# Patient Record
Sex: Male | Born: 1955 | Race: White | Hispanic: No | Marital: Married | Smoking: Current some day smoker
Health system: Southern US, Community
[De-identification: ages and names within clinical notes are randomized; demographics above are authoritative.]

## PROBLEM LIST (undated history)

## (undated) DIAGNOSIS — E785 Hyperlipidemia, unspecified: Secondary | ICD-10-CM

## (undated) DIAGNOSIS — I4510 Unspecified right bundle-branch block: Secondary | ICD-10-CM

## (undated) DIAGNOSIS — G4733 Obstructive sleep apnea (adult) (pediatric): Secondary | ICD-10-CM

## (undated) DIAGNOSIS — R55 Syncope and collapse: Secondary | ICD-10-CM

## (undated) DIAGNOSIS — E875 Hyperkalemia: Secondary | ICD-10-CM

## (undated) HISTORY — PX: CARDIAC CATHETERIZATION: SHX172

## (undated) HISTORY — DX: Hyperlipidemia, unspecified: E78.5

## (undated) HISTORY — DX: Unspecified right bundle-branch block: I45.10

## (undated) HISTORY — DX: Obstructive sleep apnea (adult) (pediatric): G47.33

## (undated) HISTORY — PX: HERNIA REPAIR: SHX51

## (undated) HISTORY — DX: Syncope and collapse: R55

## (undated) HISTORY — PX: LOOP RECORDER IMPLANT: SHX5954

## (undated) HISTORY — DX: Hyperkalemia: E87.5

---

## 2008-11-02 ENCOUNTER — Ambulatory Visit: Payer: Self-pay | Admitting: Critical Care Medicine

## 2008-11-02 ENCOUNTER — Inpatient Hospital Stay (HOSPITAL_COMMUNITY): Admission: AD | Admit: 2008-11-02 | Discharge: 2008-11-04 | Payer: Self-pay | Admitting: Internal Medicine

## 2008-11-02 ENCOUNTER — Other Ambulatory Visit: Payer: Self-pay | Admitting: Emergency Medicine

## 2008-11-02 ENCOUNTER — Other Ambulatory Visit: Payer: Self-pay | Admitting: Internal Medicine

## 2009-10-20 ENCOUNTER — Inpatient Hospital Stay (HOSPITAL_COMMUNITY): Admission: EM | Admit: 2009-10-20 | Discharge: 2009-10-23 | Payer: Self-pay | Admitting: Emergency Medicine

## 2009-10-20 ENCOUNTER — Ambulatory Visit: Payer: Self-pay | Admitting: Internal Medicine

## 2009-10-22 ENCOUNTER — Ambulatory Visit: Payer: Self-pay | Admitting: Vascular Surgery

## 2009-10-22 ENCOUNTER — Encounter: Payer: Self-pay | Admitting: Internal Medicine

## 2009-10-22 ENCOUNTER — Encounter (INDEPENDENT_AMBULATORY_CARE_PROVIDER_SITE_OTHER): Payer: Self-pay | Admitting: Internal Medicine

## 2010-06-27 ENCOUNTER — Encounter: Admission: RE | Admit: 2010-06-27 | Discharge: 2010-06-27 | Payer: Self-pay | Admitting: Family Medicine

## 2010-07-04 ENCOUNTER — Emergency Department (HOSPITAL_COMMUNITY): Admission: EM | Admit: 2010-07-04 | Discharge: 2010-07-04 | Payer: Self-pay | Admitting: Emergency Medicine

## 2010-07-11 DIAGNOSIS — K219 Gastro-esophageal reflux disease without esophagitis: Secondary | ICD-10-CM | POA: Insufficient documentation

## 2010-12-05 LAB — BASIC METABOLIC PANEL
BUN: 15 mg/dL (ref 6–23)
CO2: 21 mEq/L (ref 19–32)
Calcium: 9.3 mg/dL (ref 8.4–10.5)
Chloride: 103 mEq/L (ref 96–112)
Creatinine, Ser: 1.14 mg/dL (ref 0.4–1.5)
GFR calc Af Amer: >60 mL/min (ref >60)
GFR calc non Af Amer: >60 mL/min (ref >60)
Glucose, Bld: 237 mg/dL — ABNORMAL HIGH (ref 70–99)
Potassium: 3.6 mEq/L (ref 3.5–5.1)
Sodium: 134 mEq/L — ABNORMAL LOW (ref 135–145)

## 2010-12-05 LAB — CBC
HCT: 45.7 % (ref 39.0–52.0)
Hemoglobin: 16.5 g/dL (ref 13.0–17.0)
MCH: 34.8 pg — ABNORMAL HIGH (ref 26.0–34.0)
MCHC: 36.1 g/dL — ABNORMAL HIGH (ref 30.0–36.0)
MCV: 96.4 fL (ref 78.0–100.0)
Platelets: 196 10*3/uL (ref 150–400)
RBC: 4.74 MIL/uL (ref 4.22–5.81)
RDW: 12.1 % (ref 11.5–15.5)
WBC: 9.4 10*3/uL (ref 4.0–10.5)

## 2010-12-05 LAB — URINALYSIS, ROUTINE W REFLEX MICROSCOPIC
Bilirubin Urine: NEGATIVE
Glucose, UA: >1000 mg/dL — AB
Hgb urine dipstick: NEGATIVE
Ketones, ur: 40 mg/dL — AB
Leukocytes, UA: NEGATIVE
Nitrite: NEGATIVE
Protein, ur: NEGATIVE mg/dL
Specific Gravity, Urine: 1.041 — ABNORMAL HIGH (ref 1.005–1.030)
Urobilinogen, UA: 0.2 mg/dL (ref 0.0–1.0)
pH: 6 (ref 5.0–8.0)

## 2010-12-05 LAB — DIFFERENTIAL
Basophils Absolute: 0 10*3/uL (ref 0.0–0.1)
Basophils Relative: 0 % (ref 0–1)
Eosinophils Absolute: 0.1 10*3/uL (ref 0.0–0.7)
Eosinophils Relative: 1 % (ref 0–5)
Lymphocytes Relative: 24 % (ref 12–46)
Lymphs Abs: 2.3 10*3/uL (ref 0.7–4.0)
Monocytes Absolute: 0.5 10*3/uL (ref 0.1–1.0)
Monocytes Relative: 5 % (ref 3–12)
Neutro Abs: 6.5 10*3/uL (ref 1.7–7.7)
Neutrophils Relative %: 70 % (ref 43–77)

## 2010-12-05 LAB — GLUCOSE, CAPILLARY
Glucose-Capillary: 102 mg/dL — ABNORMAL HIGH (ref 70–99)
Glucose-Capillary: 251 mg/dL — ABNORMAL HIGH (ref 70–99)
Glucose-Capillary: 360 mg/dL — ABNORMAL HIGH (ref 70–99)

## 2010-12-05 LAB — URINE MICROSCOPIC-ADD ON

## 2010-12-08 LAB — BASIC METABOLIC PANEL
BUN: 15 mg/dL (ref 6–23)
CO2: 24 mEq/L (ref 19–32)
Calcium: 8.8 mg/dL (ref 8.4–10.5)
Chloride: 107 mEq/L (ref 96–112)
Creatinine, Ser: 0.89 mg/dL (ref 0.4–1.5)
GFR calc Af Amer: >60 mL/min (ref >60)
GFR calc non Af Amer: >60 mL/min (ref >60)
Glucose, Bld: 235 mg/dL — ABNORMAL HIGH (ref 70–99)
Potassium: 4.2 mEq/L (ref 3.5–5.1)
Sodium: 138 mEq/L (ref 135–145)

## 2010-12-08 LAB — ETHANOL: Alcohol, Ethyl (B): <5 mg/dL (ref 0–10)

## 2010-12-08 LAB — HEPATIC FUNCTION PANEL
ALT: 19 U/L (ref 0–53)
AST: 22 U/L (ref 0–37)
Albumin: 3.5 g/dL (ref 3.5–5.2)
Alkaline Phosphatase: 49 U/L (ref 39–117)
Bilirubin, Direct: 0.2 mg/dL (ref 0.0–0.3)
Indirect Bilirubin: 0.6 mg/dL (ref 0.3–0.9)
Total Bilirubin: 0.8 mg/dL (ref 0.3–1.2)
Total Protein: 5.7 g/dL — ABNORMAL LOW (ref 6.0–8.3)

## 2010-12-08 LAB — CBC
HCT: 41.3 % (ref 39.0–52.0)
HCT: 46.6 % (ref 39.0–52.0)
Hemoglobin: 14.3 g/dL (ref 13.0–17.0)
Hemoglobin: 15.7 g/dL (ref 13.0–17.0)
MCHC: 33.6 g/dL (ref 30.0–36.0)
MCV: 103.2 fL — ABNORMAL HIGH (ref 78.0–100.0)
Platelets: 163 10*3/uL (ref 150–400)
Platelets: 169 10*3/uL (ref 150–400)
RBC: 4.03 MIL/uL — ABNORMAL LOW (ref 4.22–5.81)
RBC: 4.52 MIL/uL (ref 4.22–5.81)
RDW: 13.1 % (ref 11.5–15.5)
RDW: 13.2 % (ref 11.5–15.5)
RDW: 13.4 % (ref 11.5–15.5)
WBC: 8.6 10*3/uL (ref 4.0–10.5)
WBC: 9.9 10*3/uL (ref 4.0–10.5)

## 2010-12-08 LAB — MRSA PCR SCREENING: MRSA by PCR: NEGATIVE

## 2010-12-08 LAB — POCT I-STAT, CHEM 8
Creatinine, Ser: 0.9 mg/dL (ref 0.4–1.5)
Glucose, Bld: 190 mg/dL — ABNORMAL HIGH (ref 70–99)
HCT: 42 % (ref 39.0–52.0)
Hemoglobin: 14.3 g/dL (ref 13.0–17.0)
Potassium: 3.8 mEq/L (ref 3.5–5.1)
Sodium: 139 mEq/L (ref 135–145)
TCO2: 26 mmol/L (ref 0–100)

## 2010-12-08 LAB — COMPREHENSIVE METABOLIC PANEL
ALT: 15 U/L (ref 0–53)
AST: 16 U/L (ref 0–37)
Albumin: 3 g/dL — ABNORMAL LOW (ref 3.5–5.2)
Alkaline Phosphatase: 44 U/L (ref 39–117)
BUN: 13 mg/dL (ref 6–23)
CO2: 24 mEq/L (ref 19–32)
Chloride: 111 mEq/L (ref 96–112)
Creatinine, Ser: 0.83 mg/dL (ref 0.4–1.5)
GFR calc Af Amer: >60 mL/min (ref >60)
GFR calc non Af Amer: >60 mL/min (ref >60)
Glucose, Bld: 216 mg/dL — ABNORMAL HIGH (ref 70–99)
Potassium: 4 mEq/L (ref 3.5–5.1)
Sodium: 139 mEq/L (ref 135–145)
Total Bilirubin: 0.7 mg/dL (ref 0.3–1.2)
Total Protein: 5.1 g/dL — ABNORMAL LOW (ref 6.0–8.3)

## 2010-12-08 LAB — GLUCOSE, CAPILLARY
Glucose-Capillary: 138 mg/dL — ABNORMAL HIGH (ref 70–99)
Glucose-Capillary: 140 mg/dL — ABNORMAL HIGH (ref 70–99)
Glucose-Capillary: 208 mg/dL — ABNORMAL HIGH (ref 70–99)
Glucose-Capillary: 295 mg/dL — ABNORMAL HIGH (ref 70–99)
Glucose-Capillary: 357 mg/dL — ABNORMAL HIGH (ref 70–99)
Glucose-Capillary: 366 mg/dL — ABNORMAL HIGH (ref 70–99)
Glucose-Capillary: 56 mg/dL — ABNORMAL LOW (ref 70–99)
Glucose-Capillary: 58 mg/dL — ABNORMAL LOW (ref 70–99)
Glucose-Capillary: 87 mg/dL (ref 70–99)

## 2010-12-08 LAB — CK TOTAL AND CKMB (NOT AT ARMC)
CK, MB: 1.7 ng/mL (ref 0.3–4.0)
CK, MB: 1.7 ng/mL (ref 0.3–4.0)
CK, MB: 2.3 ng/mL (ref 0.3–4.0)
Relative Index: INVALID (ref 0.0–2.5)
Relative Index: INVALID (ref 0.0–2.5)
Total CK: 73 U/L (ref 7–232)

## 2010-12-08 LAB — DIFFERENTIAL
Basophils Absolute: 0 10*3/uL (ref 0.0–0.1)
Basophils Relative: 0 % (ref 0–1)
Eosinophils Absolute: 0.2 10*3/uL (ref 0.0–0.7)
Eosinophils Relative: 3 % (ref 0–5)
Lymphocytes Relative: 23 % (ref 12–46)
Lymphs Abs: 1.9 10*3/uL (ref 0.7–4.0)
Monocytes Absolute: 0.6 10*3/uL (ref 0.1–1.0)
Monocytes Relative: 6 % (ref 3–12)
Neutro Abs: 5.8 10*3/uL (ref 1.7–7.7)
Neutrophils Relative %: 68 % (ref 43–77)

## 2010-12-08 LAB — RAPID URINE DRUG SCREEN, HOSP PERFORMED
Amphetamines: NOT DETECTED
Barbiturates: NOT DETECTED
Benzodiazepines: NOT DETECTED
Cocaine: NOT DETECTED
Opiates: NOT DETECTED
Tetrahydrocannabinol: NOT DETECTED

## 2010-12-08 LAB — PROTIME-INR
INR: 0.91 (ref 0.00–1.49)
Prothrombin Time: 12.2 seconds (ref 11.6–15.2)

## 2010-12-08 LAB — TROPONIN I
Troponin I: 0.01 ng/mL (ref 0.00–0.06)
Troponin I: 0.03 ng/mL (ref 0.00–0.06)

## 2010-12-08 LAB — POCT CARDIAC MARKERS
CKMB, poc: <1 ng/mL — ABNORMAL LOW (ref 1.0–8.0)
Myoglobin, poc: 30.1 ng/mL (ref 12–200)
Troponin i, poc: <0.05 ng/mL (ref 0.00–0.09)

## 2010-12-08 LAB — LIPID PANEL
HDL: 33 mg/dL — ABNORMAL LOW (ref >39)
Total CHOL/HDL Ratio: 4.6 RATIO
Triglycerides: 141 mg/dL (ref <150)
VLDL: 28 mg/dL (ref 0–40)

## 2010-12-08 LAB — LIPASE, BLOOD: Lipase: 18 U/L (ref 11–59)

## 2010-12-08 LAB — HEMOGLOBIN A1C: Hgb A1c MFr Bld: 9.8 % — ABNORMAL HIGH (ref 4.6–6.1)

## 2010-12-08 LAB — D-DIMER, QUANTITATIVE: D-Dimer, Quant: 0.26 ug/mL-FEU (ref 0.00–0.48)

## 2010-12-08 LAB — APTT: aPTT: 23 seconds — ABNORMAL LOW (ref 24–37)

## 2010-12-11 LAB — GLUCOSE, CAPILLARY
Glucose-Capillary: 179 mg/dL — ABNORMAL HIGH (ref 70–99)
Glucose-Capillary: 98 mg/dL (ref 70–99)

## 2010-12-29 ENCOUNTER — Inpatient Hospital Stay (HOSPITAL_COMMUNITY)
Admission: EM | Admit: 2010-12-29 | Discharge: 2010-12-30 | DRG: 125 | Disposition: A | Discharge status: Home / Self Care | Payer: BC Managed Care – PPO | Attending: Internal Medicine | Admitting: Internal Medicine

## 2010-12-29 ENCOUNTER — Emergency Department (HOSPITAL_COMMUNITY): Payer: BC Managed Care – PPO

## 2010-12-29 DIAGNOSIS — G4733 Obstructive sleep apnea (adult) (pediatric): Secondary | ICD-10-CM | POA: Diagnosis present

## 2010-12-29 DIAGNOSIS — I2510 Atherosclerotic heart disease of native coronary artery without angina pectoris: Secondary | ICD-10-CM | POA: Diagnosis present

## 2010-12-29 DIAGNOSIS — F17200 Nicotine dependence, unspecified, uncomplicated: Secondary | ICD-10-CM | POA: Diagnosis present

## 2010-12-29 DIAGNOSIS — R55 Syncope and collapse: Secondary | ICD-10-CM | POA: Diagnosis present

## 2010-12-29 DIAGNOSIS — E119 Type 2 diabetes mellitus without complications: Secondary | ICD-10-CM | POA: Diagnosis present

## 2010-12-29 DIAGNOSIS — I4510 Unspecified right bundle-branch block: Secondary | ICD-10-CM | POA: Diagnosis present

## 2010-12-29 DIAGNOSIS — I10 Essential (primary) hypertension: Secondary | ICD-10-CM | POA: Diagnosis present

## 2010-12-29 DIAGNOSIS — R079 Chest pain, unspecified: Principal | ICD-10-CM | POA: Diagnosis present

## 2010-12-29 DIAGNOSIS — E785 Hyperlipidemia, unspecified: Secondary | ICD-10-CM | POA: Diagnosis present

## 2010-12-29 DIAGNOSIS — Z9641 Presence of insulin pump (external) (internal): Secondary | ICD-10-CM

## 2010-12-29 LAB — CBC
Hemoglobin: 14 g/dL (ref 13.0–17.0)
MCH: 34.4 pg — ABNORMAL HIGH (ref 26.0–34.0)
MCH: 33.8 pg (ref 26.0–34.0)
MCHC: 35.9 g/dL (ref 30.0–36.0)
MCV: 95.7 fL (ref 78.0–100.0)
Platelets: 217 10*3/uL (ref 150–400)
RBC: 4.86 MIL/uL (ref 4.22–5.81)
RBC: 4.14 MIL/uL — ABNORMAL LOW (ref 4.22–5.81)

## 2010-12-29 LAB — POCT I-STAT, CHEM 8
BUN: 13 mg/dL (ref 6–23)
Calcium, Ion: 1.13 mmol/L (ref 1.12–1.32)
Creatinine, Ser: 1.2 mg/dL (ref 0.4–1.5)
Hemoglobin: 16 g/dL (ref 13.0–17.0)
Sodium: 143 mEq/L (ref 135–145)
TCO2: 22 mmol/L (ref 0–100)

## 2010-12-29 LAB — COMPREHENSIVE METABOLIC PANEL
Albumin: 3.1 g/dL — ABNORMAL LOW (ref 3.5–5.2)
BUN: 12 mg/dL (ref 6–23)
Creatinine, Ser: 0.82 mg/dL (ref 0.4–1.5)
Potassium: 3.6 mEq/L (ref 3.5–5.1)
Total Protein: 4.9 g/dL — ABNORMAL LOW (ref 6.0–8.3)

## 2010-12-29 LAB — DIFFERENTIAL
Basophils Relative: 0 % (ref 0–1)
Eosinophils Absolute: 0.3 10*3/uL (ref 0.0–0.7)
Lymphs Abs: 4.5 10*3/uL — ABNORMAL HIGH (ref 0.7–4.0)
Monocytes Absolute: 0.7 10*3/uL (ref 0.1–1.0)
Monocytes Relative: 7 % (ref 3–12)
Neutrophils Relative %: 49 % (ref 43–77)

## 2010-12-29 LAB — GLUCOSE, CAPILLARY
Glucose-Capillary: 71 mg/dL (ref 70–99)
Glucose-Capillary: 131 mg/dL — ABNORMAL HIGH (ref 70–99)

## 2010-12-29 LAB — CARDIAC PANEL(CRET KIN+CKTOT+MB+TROPI)
CK, MB: 2 ng/mL (ref 0.3–4.0)
Relative Index: INVALID (ref 0.0–2.5)
Total CK: 63 U/L (ref 7–232)
Total CK: 61 U/L (ref 7–232)

## 2010-12-29 LAB — CK TOTAL AND CKMB (NOT AT ARMC)
CK, MB: 2.3 ng/mL (ref 0.3–4.0)
Total CK: 90 U/L (ref 7–232)

## 2010-12-29 LAB — PROTIME-INR
INR: 0.92 (ref 0.00–1.49)
INR: 1.09 (ref 0.00–1.49)
Prothrombin Time: 12.6 seconds (ref 11.6–15.2)
Prothrombin Time: 14.3 seconds (ref 11.6–15.2)

## 2010-12-29 LAB — BASIC METABOLIC PANEL
CO2: 19 mEq/L (ref 19–32)
Chloride: 108 mEq/L (ref 96–112)
GFR calc Af Amer: >60 mL/min (ref >60)
Glucose, Bld: 54 mg/dL — ABNORMAL LOW (ref 70–99)
Sodium: 139 mEq/L (ref 135–145)

## 2010-12-29 LAB — APTT: aPTT: 22 seconds — ABNORMAL LOW (ref 24–37)

## 2010-12-29 LAB — POCT CARDIAC MARKERS

## 2010-12-30 LAB — GLUCOSE, CAPILLARY
Glucose-Capillary: 213 mg/dL — ABNORMAL HIGH (ref 70–99)
Glucose-Capillary: 241 mg/dL — ABNORMAL HIGH (ref 70–99)

## 2010-12-30 LAB — BASIC METABOLIC PANEL
BUN: 13 mg/dL (ref 6–23)
CO2: 22 mEq/L (ref 19–32)
GFR calc non Af Amer: >60 mL/min (ref >60)
Glucose, Bld: 212 mg/dL — ABNORMAL HIGH (ref 70–99)
Potassium: 4.4 mEq/L (ref 3.5–5.1)

## 2010-12-30 LAB — CARDIAC PANEL(CRET KIN+CKTOT+MB+TROPI)
Relative Index: INVALID (ref 0.0–2.5)
Troponin I: 0.05 ng/mL (ref 0.00–0.06)

## 2010-12-30 LAB — LIPID PANEL
HDL: 48 mg/dL (ref >39)
VLDL: 25 mg/dL (ref 0–40)

## 2010-12-30 LAB — CBC
HCT: 42.2 % (ref 39.0–52.0)
MCHC: 35.1 g/dL (ref 30.0–36.0)
MCV: 96.1 fL (ref 78.0–100.0)
RDW: 12.7 % (ref 11.5–15.5)
WBC: 18.3 10*3/uL — ABNORMAL HIGH (ref 4.0–10.5)

## 2011-01-02 NOTE — H&P (Signed)
Mike Stewart, INGRUM NO.:  [id number]  MEDICAL RECORD NO.:  [id number]           PATIENT TYPE:  E  LOCATION:  [facility]                         FACILITY:  [facility]  PHYSICIAN:  Arturo Morton. Riley Kill, MD, FACCDATE OF BIRTH:  [birth year]  DATE OF ADMISSION:  12/29/2010 DATE OF DISCHARGE:                             HISTORY & PHYSICAL   PRIMARY CARDIOLOGIST:  Pricilla Riffle, MD, [facility]  PRIMARY CARE PHYSICIAN:  Marcene Duos, MD  REASON FOR ADMISSION:  ST elevated MI.  HISTORY OF PRESENT ILLNESS:  This is a 55 year old Caucasian male with known history of insulin-dependent diabetes with an insulin pump, hyperlipidemia, obstructive sleep apnea, chronic tobacco who was in usual state of health, was checking blood glucose prior to eating Easter lunch, had sudden syncopal episode, became very pale.  Family called EMS.  He described a burning pain on the left side of his chest with radiation and tingling to the left arm.  EMS called Code STEMI when seeing EKG changes in inferior laterally with elevated J-point and ST elevation.  The patient was brought emergently to the emergency room with EKGs were evaluated by both Dr. Eldridge Dace and Dr. Riley Kill and it was felt that the patient would be best served to have cardiac catheterization for definitive evaluation.  The patient was brought to cardiac catheterization lab and the diagnostics are being completed at the time of this dictation.  REVIEW OF SYSTEMS:  Positive for syncope and chest pain.  All other systems are reviewed and found to be negative unless listed above.  Code status is full.  PAST MEDICAL HISTORY:  Diabetes, hyperlipidemia, obstructive sleep apnea, chronic tobacco, history of EtOH use, noncardiac chest pain. Most recent stress test, dobutamine stress echo was found to be normal in January 2011.  PAST SURGICAL HISTORY:  Hernia repair.  SOCIAL HISTORY:  He lives in [place] with his wife.  He has 2  sons. He smokes cigars and drinks alcohol.  FAMILY HISTORY:  Uncertain family history as he is undergoing cath, unable to ask.  MEDICATIONS:  Prior to admission, 1. Aspirin 81 mg daily. 2. Nitroglycerin 0.4 mg daily. 3. Pantoprazole daily. 4. Sucralfate 1 gram daily. 5. Skelaxin daily. 6. Simvastatin 40 mg daily. 7. Insulin pump.  ALLERGIES:  IODINE.  LABORATORY DATA:  Current labs; sodium 141, potassium 3.1, chloride 108, CO2 of 22, BUN 13, creatinine 1.2, glucose 51.  CBC is pending.  Chest x- ray is pending.  EKG revealing ST elevation inferior laterally with elevated J-point noted, rate of 119 beats per minute.  Chest x-ray as stated is pending.  PHYSICAL EXAM:  VITAL SIGNS:  Blood pressure 109/62, pulse 119, respirations 30, temperature 97.8, weight 160 pounds, O2 sat 100% on 2 liters. GENERAL:  He is awake and alert complaining of chest pain. HEENT:  Head is normocephalic and atraumatic.  Eyes, PERRLA. NECK:  Supple.  There is no JVD.  No carotid bruit appreciated. CARDIOVASCULAR:  Regular rate and rhythm without murmurs, rubs, or gallops.  Pluses are 2+ and equal. LUNGS:  Clear to auscultation without wheezes, rales, or rhonchi. ABDOMEN:  Soft and nontender and insulin pump is noted on the left. EXTREMITIES:  Without clubbing, cyanosis, or edema. MUSCULOSKELETAL:  No joint deformity or effusions. NEURO:  Cranial nerves II through XII are grossly intact.  IMPRESSION: 1. ST elevated myocardial infarction, emergent cardiac catheterization     underway. 2. Insulin-dependent diabetes with insulin pump parameters per     primary. 3. History of hyperlipidemia.  PLAN:  The patient is undergoing diagnostic cardiac catheterization at the time of this dictation.  Please see Dr. Hoyle Barr note for more information concerning plan after catheterization is completed.     Bettey Mare. Lyman Bishop, NP   ______________________________ Arturo Morton Riley Kill, MD,  [facility]    KML/MEDQ  D:  12/29/2010  T:  12/29/2010  Job:  [id number]  cc:   Marcene Duos, M.D.  Electronically Signed by Joni Reining NP on 12/30/2010 11:17:04 AM Electronically Signed by Shawnie Pons MD [facility] on 01/02/2011 05:41:57 AM

## 2011-01-06 ENCOUNTER — Emergency Department (INDEPENDENT_AMBULATORY_CARE_PROVIDER_SITE_OTHER): Payer: BC Managed Care – PPO

## 2011-01-06 ENCOUNTER — Encounter (HOSPITAL_BASED_OUTPATIENT_CLINIC_OR_DEPARTMENT_OTHER): Payer: Self-pay | Admitting: Radiology

## 2011-01-06 ENCOUNTER — Emergency Department (HOSPITAL_BASED_OUTPATIENT_CLINIC_OR_DEPARTMENT_OTHER)
Admission: EM | Admit: 2011-01-06 | Discharge: 2011-01-06 | Disposition: A | Discharge status: Home / Self Care | Payer: BC Managed Care – PPO | Attending: Emergency Medicine | Admitting: Emergency Medicine

## 2011-01-06 DIAGNOSIS — R079 Chest pain, unspecified: Secondary | ICD-10-CM | POA: Insufficient documentation

## 2011-01-06 DIAGNOSIS — E119 Type 2 diabetes mellitus without complications: Secondary | ICD-10-CM | POA: Insufficient documentation

## 2011-01-06 DIAGNOSIS — I10 Essential (primary) hypertension: Secondary | ICD-10-CM | POA: Insufficient documentation

## 2011-01-06 DIAGNOSIS — F17200 Nicotine dependence, unspecified, uncomplicated: Secondary | ICD-10-CM | POA: Insufficient documentation

## 2011-01-06 DIAGNOSIS — E785 Hyperlipidemia, unspecified: Secondary | ICD-10-CM | POA: Insufficient documentation

## 2011-01-06 DIAGNOSIS — R091 Pleurisy: Secondary | ICD-10-CM | POA: Insufficient documentation

## 2011-01-06 DIAGNOSIS — M79609 Pain in unspecified limb: Secondary | ICD-10-CM

## 2011-01-06 LAB — POCT I-STAT, CHEM 8
BUN: 20 mg/dL (ref 6–23)
Calcium, Ion: 1.13 mmol/L (ref 1.12–1.32)
Creatinine, Ser: 1 mg/dL (ref 0.4–1.5)
Glucose, Bld: 273 mg/dL — ABNORMAL HIGH (ref 70–99)
Hemoglobin: 15.6 g/dL (ref 13.0–17.0)
TCO2: 21 mmol/L (ref 0–100)

## 2011-01-06 LAB — CBC
HCT: 40.8 % (ref 39.0–52.0)
Hemoglobin: 14.7 g/dL (ref 13.0–17.0)
MCH: 33.3 pg (ref 26.0–34.0)
MCHC: 36 g/dL (ref 30.0–36.0)
MCV: 92.3 fL (ref 78.0–100.0)
RBC: 4.42 MIL/uL (ref 4.22–5.81)

## 2011-01-06 LAB — DIFFERENTIAL
Basophils Relative: 0 % (ref 0–1)
Lymphocytes Relative: 24 % (ref 12–46)
Lymphs Abs: 1.8 10*3/uL (ref 0.7–4.0)
Monocytes Absolute: 0.6 10*3/uL (ref 0.1–1.0)
Monocytes Relative: 8 % (ref 3–12)
Neutro Abs: 4.9 10*3/uL (ref 1.7–7.7)

## 2011-01-06 MED ORDER — IOHEXOL 350 MG/ML SOLN
100.0000 mL | Freq: Once | INTRAVENOUS | Status: AC | PRN
  Administered 2011-01-06: 80 mL via INTRAVENOUS

## 2011-01-07 LAB — HEPATIC FUNCTION PANEL
ALT: 38 U/L (ref 0–53)
AST: 30 U/L (ref 0–37)
Alkaline Phosphatase: 99 U/L (ref 39–117)
Bilirubin, Direct: 0 mg/dL (ref 0.0–0.3)
Total Bilirubin: 1 mg/dL (ref 0.3–1.2)

## 2011-01-07 LAB — PHOSPHORUS: Phosphorus: 4.1 mg/dL (ref 2.3–4.6)

## 2011-01-07 LAB — GLUCOSE, CAPILLARY
Glucose-Capillary: 105 mg/dL — ABNORMAL HIGH (ref 70–99)
Glucose-Capillary: 112 mg/dL — ABNORMAL HIGH (ref 70–99)
Glucose-Capillary: 114 mg/dL — ABNORMAL HIGH (ref 70–99)
Glucose-Capillary: 125 mg/dL — ABNORMAL HIGH (ref 70–99)
Glucose-Capillary: 172 mg/dL — ABNORMAL HIGH (ref 70–99)
Glucose-Capillary: 186 mg/dL — ABNORMAL HIGH (ref 70–99)
Glucose-Capillary: 192 mg/dL — ABNORMAL HIGH (ref 70–99)
Glucose-Capillary: 217 mg/dL — ABNORMAL HIGH (ref 70–99)
Glucose-Capillary: 222 mg/dL — ABNORMAL HIGH (ref 70–99)
Glucose-Capillary: 302 mg/dL — ABNORMAL HIGH (ref 70–99)
Glucose-Capillary: 401 mg/dL — ABNORMAL HIGH (ref 70–99)
Glucose-Capillary: 44 mg/dL — ABNORMAL LOW (ref 70–99)
Glucose-Capillary: 45 mg/dL — ABNORMAL LOW (ref 70–99)
Glucose-Capillary: 45 mg/dL — ABNORMAL LOW (ref 70–99)
Glucose-Capillary: 80 mg/dL (ref 70–99)
Glucose-Capillary: 81 mg/dL (ref 70–99)
Glucose-Capillary: 94 mg/dL (ref 70–99)
Glucose-Capillary: 98 mg/dL (ref 70–99)

## 2011-01-07 LAB — BASIC METABOLIC PANEL
BUN: 13 mg/dL (ref 6–23)
BUN: 16 mg/dL (ref 6–23)
BUN: 23 mg/dL (ref 6–23)
CO2: 22 mEq/L (ref 19–32)
CO2: 25 mEq/L (ref 19–32)
Calcium: 8.3 mg/dL — ABNORMAL LOW (ref 8.4–10.5)
Calcium: 8.5 mg/dL (ref 8.4–10.5)
Calcium: 8.6 mg/dL (ref 8.4–10.5)
Chloride: 92 mEq/L — ABNORMAL LOW (ref 96–112)
Creatinine, Ser: 0.81 mg/dL (ref 0.4–1.5)
Creatinine, Ser: 0.99 mg/dL (ref 0.4–1.5)
Creatinine, Ser: 1 mg/dL (ref 0.4–1.5)
GFR calc Af Amer: >60 mL/min (ref >60)
GFR calc non Af Amer: >60 mL/min (ref >60)
GFR calc non Af Amer: >60 mL/min (ref >60)
GFR calc non Af Amer: >60 mL/min (ref >60)
GFR calc non Af Amer: >60 mL/min (ref >60)
Glucose, Bld: 113 mg/dL — ABNORMAL HIGH (ref 70–99)
Glucose, Bld: 227 mg/dL — ABNORMAL HIGH (ref 70–99)
Glucose, Bld: 659 mg/dL (ref 70–99)
Potassium: 3.7 mEq/L (ref 3.5–5.1)
Potassium: 4.6 mEq/L (ref 3.5–5.1)
Sodium: 135 mEq/L (ref 135–145)
Sodium: 136 mEq/L (ref 135–145)
Sodium: 138 mEq/L (ref 135–145)

## 2011-01-07 LAB — URINE MICROSCOPIC-ADD ON

## 2011-01-07 LAB — LIPID PANEL
Total CHOL/HDL Ratio: 6.2 RATIO
VLDL: 24 mg/dL (ref 0–40)

## 2011-01-07 LAB — DIFFERENTIAL
Basophils Absolute: 0.2 10*3/uL — ABNORMAL HIGH (ref 0.0–0.1)
Basophils Relative: 0 % (ref 0–1)
Eosinophils Absolute: 0.2 10*3/uL (ref 0.0–0.7)
Eosinophils Relative: 2 % (ref 0–5)
Lymphocytes Relative: 22 % (ref 12–46)
Lymphs Abs: 2.3 10*3/uL (ref 0.7–4.0)
Monocytes Absolute: 0.7 10*3/uL (ref 0.1–1.0)
Monocytes Relative: 7 % (ref 3–12)
Monocytes Relative: 7 % (ref 3–12)
Neutro Abs: 6.9 10*3/uL (ref 1.7–7.7)
Neutrophils Relative %: 62 % (ref 43–77)
Neutrophils Relative %: 68 % (ref 43–77)

## 2011-01-07 LAB — CBC
Hemoglobin: 13.9 g/dL (ref 13.0–17.0)
MCHC: 34.2 g/dL (ref 30.0–36.0)
MCV: 99.9 fL (ref 78.0–100.0)
Platelets: 152 10*3/uL (ref 150–400)
Platelets: 215 10*3/uL (ref 150–400)
RBC: 4.04 MIL/uL — ABNORMAL LOW (ref 4.22–5.81)
RDW: 12.5 % (ref 11.5–15.5)
RDW: 13.3 % (ref 11.5–15.5)
WBC: 7.8 10*3/uL (ref 4.0–10.5)
WBC: 9.3 10*3/uL (ref 4.0–10.5)

## 2011-01-07 LAB — POCT I-STAT 3, ART BLOOD GAS (G3+)
Bicarbonate: 19.8 mEq/L — ABNORMAL LOW (ref 20.0–24.0)
O2 Saturation: 94 %
Patient temperature: 37
TCO2: 21 mmol/L (ref 0–100)
pH, Arterial: 7.387 (ref 7.350–7.450)

## 2011-01-07 LAB — KETONES, QUALITATIVE

## 2011-01-07 LAB — COMPREHENSIVE METABOLIC PANEL
ALT: 29 U/L (ref 0–53)
Alkaline Phosphatase: 52 U/L (ref 39–117)
CO2: 23 mEq/L (ref 19–32)
GFR calc non Af Amer: >60 mL/min (ref >60)
Glucose, Bld: 163 mg/dL — ABNORMAL HIGH (ref 70–99)
Potassium: 3.7 mEq/L (ref 3.5–5.1)
Sodium: 137 mEq/L (ref 135–145)
Total Protein: 4.8 g/dL — ABNORMAL LOW (ref 6.0–8.3)

## 2011-01-07 LAB — URINALYSIS, ROUTINE W REFLEX MICROSCOPIC
Bilirubin Urine: NEGATIVE
Glucose, UA: >1000 mg/dL — AB
Hgb urine dipstick: NEGATIVE
Ketones, ur: 15 mg/dL — AB
Ketones, ur: 40 mg/dL — AB
Nitrite: NEGATIVE
Protein, ur: NEGATIVE mg/dL
Urobilinogen, UA: 0.2 mg/dL (ref 0.0–1.0)
pH: 5.5 (ref 5.0–8.0)

## 2011-01-07 LAB — POCT CARDIAC MARKERS: Troponin i, poc: <0.05 ng/mL (ref 0.00–0.09)

## 2011-01-09 ENCOUNTER — Ambulatory Visit (INDEPENDENT_AMBULATORY_CARE_PROVIDER_SITE_OTHER): Payer: BC Managed Care – PPO | Admitting: Internal Medicine

## 2011-01-09 ENCOUNTER — Encounter: Payer: Self-pay | Admitting: Internal Medicine

## 2011-01-09 VITALS — BP 123/75 | HR 77 | Resp 14 | Ht 70.0 in | Wt 165.0 lb

## 2011-01-09 DIAGNOSIS — G4733 Obstructive sleep apnea (adult) (pediatric): Secondary | ICD-10-CM

## 2011-01-09 DIAGNOSIS — R55 Syncope and collapse: Secondary | ICD-10-CM

## 2011-01-09 DIAGNOSIS — E119 Type 2 diabetes mellitus without complications: Secondary | ICD-10-CM

## 2011-01-09 NOTE — Patient Instructions (Addendum)
Your physician recommends that you schedule a follow-up appointment in: 4 weeks with Dr Johney Frame  Your physician has requested that you have an echocardiogram. Echocardiography is a painless test that uses sound waves to create images of your heart. It provides your doctor with information about the size and shape of your heart and how well your heart's chambers and valves are working. This procedure takes approximately one hour. There are no restrictions for this procedure. Dx: Syncope  Your physician has recommended that you wear a holter monitor. Holter monitors are medical devices that record the heart's electrical activity. Doctors most often use these monitors to diagnose arrhythmias. Arrhythmias are problems with the speed or rhythm of the heartbeat. The monitor is a small, portable device. You can wear one while you do your normal daily activities. This is usually used to diagnose what is causing palpitations/syncope (passing out).(48 hour)  Dx:  Syncope

## 2011-01-09 NOTE — Progress Notes (Signed)
Mike Stewart is a pleasant 55 yo WM with a h/o diabetes and recurrence syncope who presents today for EP consultation after recent hospitalization for syncope.  He reports having intermittent episodes of syncope for "years".  He reports that his initial episodes occurred in high school.  He reports episodes of syncope about once per year over the past 10 years.  He is unaware of any triggers or precipitants prior to syncope.  Episodes occur when standing or seated.  He reports abrupt loss of consciousness without warning.  He has checked his blood sugars around episodes, most recently BS was 108 immediately prior to syncope.  He reports that loss of consciousness lasts less than a minutes, though it takes him several minutes typically to regain his composure.  He has not been observed to have seizure activity, tongue biting, or incontinence.  He denies associated palpitations, nausea, vomiting, or neurologic symptoms preceeding the event.  He thinks that he may have chest pain afterwards.  He reports occasional "heart racing" but has never felt this associated with syncope.   The patient's spouse states that 2/11 they were seated and eating when he abruptly  He leaned forward, threw up, and then collapsed with LOC for several seconds.  He felt fine before and after this event. He has chronic atypical sharp chest pains.  He has recently been evaluated with cath and CTA which have been unrevealing.  Today, he denies symptoms of shortness of breath, orthopnea, PND, lower extremity edema, dizziness,  or neurologic sequela. The patient is tolerating medications without difficulties and is otherwise without complaint today.   Past Medical History  Diagnosis Date  . Diabetes mellitus     dx 14 years ago  . Hyperlipidemia   . Syncope   . Obstructive sleep apnea     not compliant with CPAP   Past Surgical History  Procedure Date  . Hernia repair     Current Outpatient Prescriptions  Medication Sig Dispense  Refill  . aspirin 81 MG tablet Take 81 mg by mouth daily.        . insulin aspart (NOVOLOG) 100 UNIT/ML injection As directed         Allergies  Allergen Reactions  . Omnipaque (Iohexol)     Patient can be pre-medicated  . Phenobarbital     History   Social History  . Marital Status: Married    Spouse Name: N/A    Number of Children: N/A  . Years of Education: N/A   Occupational History  . Not on file.   Social History Main Topics  . Smoking status: Current Some Day Smoker    Types: Cigars  . Smokeless tobacco: Not on file   Comment: smokes several cigars per day  . Alcohol Use: Yes     several beers on the weekends  . Drug Use: No  . Sexually Active: Not on file   Other Topics Concern  . Not on file   Social History Narrative   Lives in [place] with spouse.  He is employed as a Teaching laboratory technician.    Family History  Problem Relation Age of Onset  . Adopted: Yes  He is adopted and has no available information about his family regarding heart disease, arrhythmias, sudden death, ETC.  ROS- All systems are reviewed and negative except as per the HPI above  Physical Exam: Filed Vitals:   01/09/11 1042  BP: 122/68  Pulse: 66  Resp: 14  Height: 5\' 10"  (1.778 m)  Weight: 165 lb (74.844 kg)    GEN- The patient is well appearing, alert and oriented x 3 today.   Head- normocephalic, atraumatic Eyes-  Sclera clear, conjunctiva pink Ears- hearing intact Oropharynx- clear Neck- supple, no JVP Lymph- no cervical lymphadenopathy Lungs- Clear to ausculation bilaterally, normal work of breathing Heart- Regular rate and rhythm, no murmurs, rubs or gallops, PMI not laterally displaced GI- soft, NT, ND, + BS Extremities- no clubbing, cyanosis, or edema MS- no significant deformity or atrophy Skin- no rash or lesion Psych- euthymic mood, full affect Neuro- strength and sensation are intact  EKG-  Sinus rhythm 66 bpm, PR 134, RsR', QRS 110, Qtc  415  Assessment and Plan:

## 2011-01-12 ENCOUNTER — Encounter: Payer: Self-pay | Admitting: Internal Medicine

## 2011-01-12 DIAGNOSIS — Z9989 Dependence on other enabling machines and devices: Secondary | ICD-10-CM | POA: Insufficient documentation

## 2011-01-12 DIAGNOSIS — R55 Syncope and collapse: Secondary | ICD-10-CM | POA: Insufficient documentation

## 2011-01-12 NOTE — Assessment & Plan Note (Addendum)
The patient presents today for EP consultation regarding syncope.  He has had multiple prior episodes of syncope of unclear etiology.  The abrupt onset of episodes are possibly suggestive of an arrhythmic source.  He is mildly orthostatic by HR today. I have encouraged adequate hydration and diabetes control (see below).  Unfortunately, he is adopted and unable to provide helpful family history.  His EKG today is reviewed today and reveals only RsR'.  I think that we should place a holter monitor to evaluate for arrhythmias and also obtain an echo to evaluate for structural heart disease.  If these are unrevealing, I would strongly consider implantable loop recorder placement to further evaluate his recurrent unexplained syncope.  He is aware that DMV requires no driving x 6 months following syncope. I will see him back after echo and holter results are available.

## 2011-01-12 NOTE — Assessment & Plan Note (Signed)
Recent A1C suggest very poor glycemic control.  I have educated the patient about osmotic diuresis caused by glucosuria which promotes significant dehydration and may lead to syncope.  He will follow up with his PCP regarding his diabetes.

## 2011-01-12 NOTE — Assessment & Plan Note (Signed)
He is not compliant with CPAP

## 2011-01-13 ENCOUNTER — Ambulatory Visit: Payer: BC Managed Care – PPO | Admitting: Internal Medicine

## 2011-01-16 ENCOUNTER — Encounter (INDEPENDENT_AMBULATORY_CARE_PROVIDER_SITE_OTHER): Payer: BC Managed Care – PPO

## 2011-01-16 ENCOUNTER — Ambulatory Visit (HOSPITAL_COMMUNITY): Payer: BC Managed Care – PPO | Attending: Internal Medicine | Admitting: Radiology

## 2011-01-16 DIAGNOSIS — R55 Syncope and collapse: Secondary | ICD-10-CM

## 2011-01-16 DIAGNOSIS — E785 Hyperlipidemia, unspecified: Secondary | ICD-10-CM | POA: Insufficient documentation

## 2011-01-16 DIAGNOSIS — E119 Type 2 diabetes mellitus without complications: Secondary | ICD-10-CM | POA: Insufficient documentation

## 2011-01-16 DIAGNOSIS — F17200 Nicotine dependence, unspecified, uncomplicated: Secondary | ICD-10-CM | POA: Insufficient documentation

## 2011-01-23 NOTE — Discharge Summary (Signed)
Mike Stewart, Mike Stewart NO.:  [id number]  MEDICAL RECORD NO.:  [id number]           PATIENT TYPE:  LOCATION:                                 FACILITY:  PHYSICIAN:  Veverly Fells. Excell Seltzer, MD  DATE OF BIRTH:  Jun 16, 1956  DATE OF ADMISSION:  12/29/2010 DATE OF DISCHARGE:  12/30/2010                              DISCHARGE SUMMARY   PRIMARY CARDIOLOGIST:  The patient will follow up Dr. Hillis Range.  PRIMARY CARE PROVIDER:  Maryelizabeth Rowan, M.D.  DISCHARGE DIAGNOSIS:  Chest pain without objective evidence of ischemia.  SECONDARY DIAGNOSES: 1. 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 2. History of normal dobutamine echo in 2011. 3. Poorly controlled insulin-dependent diabetes mellitus with a     hemoglobin A1c of 11.5. 4. Recurrent syncope. 5. Hyperlipidemia. 6. Sleep apnea. 7. Ongoing tobacco abuse. 8. Status post hernia repair. 9. Right bundle-branch block.  ALLERGIES:  IV CONTRAST, IODINE, and PHENOBARBITAL.  PROCEDURES:  Diagnostic left heart cardiac catheterization revealing nonobstructive proximal LAD stenosis of approximately 25% as well as ostial first diagonal stenosis 25%.  Otherwise, normal coronary arteries.  Left ventriculography revealed normal LV function with an EF of 55%.  HISTORY OF PRESENT ILLNESS:  A 54 year old male with poorly-controlled insulin dependent diabetes mellitus despite ongoing usage of an insulin pump and close outpatient followup.  The patient has a history of chest pain dating back to January 2011 at which time, the patient presented to the ED with unresponsiveness and reported complaints of chest pain.  The patient ruled out during that admission and underwent dobutamine echo, which was negative.  The patient also has a history of infrequent syncopal spells occurring maybe 4 to 5 times in his adult life.  These often occur without any prodrome with loss of conscious lasting a few minutes and resolving  spontaneously.  He has never suffered any severe injuries.  The patient was in his usual state of health until April 8 when he was preparing for his [employer], was checking his blood sugar which he found to be 104.  Per family, the patient then turned towards the dining room table and suddenly fell to the side initially striking another family member before reaching the floor.  He did not suffer any traumatic injuries.  The patient was rolled onto his back and attended to by his son where after vigorous attempts at arousal, the patient regained consciousness within a minute or two.  The patient says that upon regaining consciousness, he was very diaphoretic.  He also noted mild chest pain and bilateral arm numbness.  He was not dyspneic and he did not experience emesis.  EMS was called and upon EMS arrival, they noted abnormal EKG with question of lateral ST-segment elevation.  A code STEMI was activated and the patient was taken to [facility] for further evaluation.  HOSPITAL COURSE:  Upon arrival to [facility], ECG was reviewed and given the patient's risk factors and ongoing symptoms, decision was made to pursue cardiac catheterization.  Diagnostic catheterization revealed relatively normal coronary arteries and normal LV function. There was no targets  for intervention or findings to explain the patient's symptoms.  He was subsequently admitted to the coronary intensive care unit where cardiac markers have remained negative.  His D- dimer is also normal and LFTs are within normal limits.  The patient has had no further chest discomfort.  He has had no events on telemetry to explain his syncopal episode.  He has been ambulating without difficulty and has been counseled on smoking cessation.  He will be discharged home today in good condition.  We have arranged for him to follow up with Dr. Hillis Range in 2 weeks for further evaluation of recurrent syncope  and consideration of electrophysiologic study plus/minus implantable loop recorder.  DISCHARGE LABS:  Hemoglobin 14.8, hematocrit 42.2, WBC 18.3 (the patient received Solu-Medrol for contrast prophylaxis), platelets 177, D-dimer less than 0.22.  Sodium 137, potassium 4.4, chloride 108, CO2 22, BUN 13, creatinine 0.77, glucose 212, total bilirubin 0.5, alkaline phosphatase 57, AST 17, ALT 17, total protein 4.9, albumin 3.1, calcium 8.7, hemoglobin A1c 11.5, CK 55, MB 1.9, troponin-I 0.05, total cholesterol 211, triglycerides 127, HDL 48, LDL 138.  MRSA screen was negative.  DISPOSITION:  The patient will be discharged home today in good condition.  FOLLOWUP APPOINTMENTS:  We have arranged for followup with Dr. Hillis Range on April 23 at 3:00 p.m.  He will follow up with Dr. Duanne Guess in the next 1 to 2 weeks.  DISCHARGE MEDICATIONS:  Aspirin 81 mg daily, Lipitor 10 mg at bedtime, Prilosec OTC 20 mg daily, Claritin 10 mg daily, insulin pump as directed by Dr. Duanne Guess.  OUTSTANDING LAB STUDIES:  Followup lipids and LFTs in 6 to 8 weeks.  Duration of discharge encounter 60 minutes including physician time.     Nicolasa Ducking, ANP   ______________________________ Veverly Fells. Excell Seltzer, MD    CB/MEDQ  D:  12/30/2010  T:  12/31/2010  Job:  [id number]  cc:   Maryelizabeth Rowan, M.D.  Electronically Signed by Nicolasa Ducking ANP on 01/18/2011 03:58:40 PM Electronically Signed by Tonny Bollman MD on 01/23/2011 10:37:34 AM

## 2011-01-23 NOTE — Cardiovascular Report (Signed)
Mike Stewart, Mike Stewart NO.:  [id number]  MEDICAL RECORD NO.:  [id number]           PATIENT TYPE:  I  LOCATION:  2903                         FACILITY:  [facility]  PHYSICIAN:  Corky Crafts, MDDATE OF BIRTH:  Jul 01, 1956  DATE OF PROCEDURE:  12/29/2010 DATE OF DISCHARGE:                           CARDIAC CATHETERIZATION   PRIMARY CARDIOLOGIST:  Pricilla Riffle, MD, [facility]  PROCEDURES PERFORMED: 1. Left heart catheterization. 2. Left ventriculogram. 3. Coronary angiogram. 4. Abdominal aortogram.  OPERATOR:  Corky Crafts, MD  INDICATION:  Question of anterior ST-elevation MI.  PROCEDURE NARRATIVE:  The patient was brought emergently to the cath lab.  He was prepped and draped in the usual sterile fashion.  His right wrist was infiltrated with 1% lidocaine, a 6-French glide sheath was placed into right radial artery using the modified Seldinger technique. Right coronary artery angiography was performed using a JR-4 pigtail catheter.  The catheter was advanced into the vessel ostium under fluoroscopic guidance.  Digital angiography was performed in multiple projections using hand injection of contrast.  Left coronary artery angiography was performed with a CLS3 guiding catheter in a similar fashion.  Pigtail catheter was advanced through the ascending aorta and across the aortic valve under fluoroscopic guidance.  Power injection of contrast performed in the RAO projection to image the left ventricle. Catheter was pulled back under continuous hemodynamic pressure monitoring.  The catheter could not be directed down the descending aorta.  The right coronary catheter was used to direct the wire down the descending and a pigtail was placed down the descending aorta.  Power injection of contrast was performed in the AP projection.  The sheath was removed and a TR band was used for hemostasis.  FINDINGS:  Right coronary artery is a large dominant vessel with  mild irregularities.  There is an early bifurcation of the PDA and posterolateral artery.  Both vessels are large and appear widely patent. Left main is widely patent. The left circumflex is a large vessel.  In the proximal portion of the vessel, there is mild disease up to 25%.  The OM-1 is large vessel which is widely patent.  The circ continuation branch is a medium-sized vessel and patent. Left anterior descending is a large vessel to the apex.  There is mild proximal disease up to 25%.  The ostium of the first diagonal has 25% stenosis.  Second and third diagonals were small vessels but widely patent.  The distal LAD is small but patent. Left ventriculogram shows normal ventricular function with an estimated ejection fraction of 55%.  HEMODYNAMIC RESULTS:  Left ventricular pressure 101/40 with an LVEDP of 70 mmHg.  Aortic pressure 105/65 with a mean aortic pressure of 83 mmHg. The abdominal aortogram shows no abdominal aortic aneurysm and bilateral single renal arteries which were patent.  IMPRESSION: 1. Mild nonobstructive coronary artery disease. 2. Normal left ventricular function. 3. Normal hemodynamics.  RECOMMENDATIONS:  Continue to investigate other etiologies.  Chest pain does not appear related to coronary artery disease or aortic dissection. Check D-dimer.  Continue aggressive preventive therapy for coronary artery disease.  Corky Crafts, MD     JSV/MEDQ  D:  12/29/2010  T:  12/30/2010  Job:  [id number]  Electronically Signed by Lance Muss MD on 01/23/2011 01:29:44 PM

## 2011-01-31 ENCOUNTER — Encounter: Payer: Self-pay | Admitting: Internal Medicine

## 2011-01-31 ENCOUNTER — Encounter: Payer: Self-pay | Admitting: *Deleted

## 2011-02-03 ENCOUNTER — Ambulatory Visit (INDEPENDENT_AMBULATORY_CARE_PROVIDER_SITE_OTHER): Payer: BC Managed Care – PPO | Admitting: Internal Medicine

## 2011-02-03 ENCOUNTER — Encounter: Payer: Self-pay | Admitting: Internal Medicine

## 2011-02-03 ENCOUNTER — Encounter: Payer: Self-pay | Admitting: *Deleted

## 2011-02-03 VITALS — BP 120/80 | HR 88 | Ht 70.0 in | Wt 167.0 lb

## 2011-02-03 DIAGNOSIS — R55 Syncope and collapse: Secondary | ICD-10-CM

## 2011-02-03 NOTE — Progress Notes (Signed)
Mike Stewart is a pleasant 55 yo WM with a h/o diabetes and recurrence syncope who presents today for EP follow-up after recent hospitalization for syncope.  He reports having intermittent episodes of syncope for "years".  He reports that his initial episodes occurred in high school.  He reports episodes of syncope about once per year over the past 10 years.  He is unaware of any triggers or precipitants prior to syncope.  Episodes occur when standing or seated.  He reports abrupt loss of consciousness without warning.  He has checked his blood sugars around episodes, most recently BS was 108 immediately prior to syncope.  He reports that loss of consciousness lasts less than a minutes, though it takes him several minutes typically to regain his composure.  He has not been observed to have seizure activity, tongue biting, or incontinence.  He denies associated palpitations, nausea, vomiting, or neurologic symptoms preceeding the event.  He thinks that he may have chest pain afterwards.  He reports occasional "heart racing" but has never felt this associated with syncope.   The patient's spouse states that 2/11 they were seated and eating when he abruptly  He leaned forward, threw up, and then collapsed with LOC for several seconds.  He felt fine before and after this event. He has chronic atypical sharp chest pains.  He has recently been evaluated with cath and CTA which have been unrevealing.  He has had no further syncope since last being seen in my office.  Today, he denies symptoms of shortness of breath, orthopnea, PND, lower extremity edema, dizziness,  or neurologic sequela. The patient is tolerating medications without difficulties and is otherwise without complaint today.   Past Medical History  Diagnosis Date  . Diabetes mellitus     dx 14 years ago  . Hyperlipidemia   . Syncope     recurrent unexplained syncope  . Obstructive sleep apnea     not compliant with CPAP  . RBBB (right bundle branch  block)   . Hyperkalemia    Past Surgical History  Procedure Date  . Hernia repair   . Cardiac catheterization     Current Outpatient Prescriptions  Medication Sig Dispense Refill  . aspirin 81 MG tablet Take 81 mg by mouth daily.        . insulin aspart (NOVOLOG) 100 UNIT/ML injection As directed         Allergies  Allergen Reactions  . Omnipaque (Iohexol)     Patient can be pre-medicated  . Phenobarbital     History   Social History  . Marital Status: Married    Spouse Name: N/A    Number of Children: N/A  . Years of Education: N/A   Occupational History  . Not on file.   Social History Main Topics  . Smoking status: Current Some Day Smoker    Types: Cigars  . Smokeless tobacco: Not on file   Comment: smokes several cigars per day  . Alcohol Use: Yes     several beers on the weekends  . Drug Use: No  . Sexually Active: Not on file   Other Topics Concern  . Not on file   Social History Narrative   Lives in [place] with spouse.  He is employed as a Teaching laboratory technician.    Family History  Problem Relation Age of Onset  . Adopted: Yes  He is adopted and has no available information about his family regarding heart disease, arrhythmias, sudden death, ETC.  ROS- All systems are reviewed and negative except as per the HPI above  Physical Exam: Filed Vitals:   02/03/11 1536  BP: 120/80  Pulse: 88  Height: 5\' 10"  (1.778 m)  Weight: 167 lb (75.751 kg)    GEN- The patient is well appearing, alert and oriented x 3 today.   Head- normocephalic, atraumatic Eyes-  Sclera clear, conjunctiva pink Ears- hearing intact Oropharynx- clear Neck- supple, no JVP Lymph- no cervical lymphadenopathy Lungs- Clear to ausculation bilaterally, normal work of breathing Heart- Regular rate and rhythm, no murmurs, rubs or gallops, PMI not laterally displaced GI- soft, NT, ND, + BS Extremities- no clubbing, cyanosis, or edema MS- no significant deformity or  atrophy Skin- no rash or lesion Psych- euthymic mood, full affect Neuro- strength and sensation are intact  EKG from last visit-  Sinus rhythm 66 bpm, PR 134, RsR', QRS 110, Qtc 415 Echo 01/16/11- LVEF 60-65%, RV function and size normal, no significant abnormality Holter 48 hours- 01/16/11- no arrhythmias  Assessment and Plan:

## 2011-02-03 NOTE — Assessment & Plan Note (Signed)
The patient presents today for EP consultation regarding syncope.  He has had multiple prior episodes of syncope of unclear etiology.  The abrupt onset of episodes are possibly suggestive of an arrhythmic source. Unfortunately, he is adopted and unable to provide helpful family history.  His EKG is reviewed today and reveals only RsR'. His holter and Echo are normal.  At this point, I would recommend implantation of a REVEAL loop recorder.  Risks, benefits, and alternatives to ILR implantation were discussed at length with the patient today who wishes to proceed. We will therefore proceed with ILR implantation at the next available time. He is aware that DMV requires no driving x 6 months following syncope.

## 2011-02-03 NOTE — Patient Instructions (Signed)
Your physician has recommended that you have a loop recorder.  See instruction sheet

## 2011-02-03 NOTE — Assessment & Plan Note (Signed)
Adequate glucose control and hydration were encouraged again today.

## 2011-02-04 LAB — BASIC METABOLIC PANEL
Calcium: 9.5 mg/dL (ref 8.4–10.5)
GFR: 82.31 mL/min (ref >60.00)
Glucose, Bld: 99 mg/dL (ref 70–99)
Potassium: 4.5 mEq/L (ref 3.5–5.1)
Sodium: 140 mEq/L (ref 135–145)

## 2011-02-04 LAB — PROTIME-INR: INR: 0.9 ratio (ref 0.8–1.0)

## 2011-02-04 NOTE — Discharge Summary (Signed)
Mike Stewart, Mike Stewart NO.:  [id number]   MEDICAL RECORD NO.:  [id number]          PATIENT TYPE:  INP   LOCATION:  2014                         FACILITY:  [facility]   PHYSICIAN:  Isidor Holts, M.D.  DATE OF BIRTH:  16-Aug-1956   DATE OF ADMISSION:  11/02/2008  DATE OF DISCHARGE:  11/04/2008                               DISCHARGE SUMMARY   PRIMARY MEDICAL DOCTOR:  Dr. Maryelizabeth Rowan.   PRIMARY ENDOCRINOLOGIST:  Dr. Kathrynn Humble, [address], [place],  [place].   DISCHARGE DIAGNOSES:  1. Type 1 diabetes mellitus.  2. Hyperosmolar hyperglycemic nonketotic state.  3. History of obstructive sleep apnea syndrome.  4. Hypoglycemic episodes.  5. Dyslipidemia.   DISCHARGE MEDICATIONS:  1. Aspirin 81 mg p.o. daily.  2. Zocor 20 mg p.o. every night.  3. Subcutaneous regular insulin infusion via pump, in pre-admission      regimen.   PROCEDURES:  None.   CONSULTATIONS:  None.   ADMISSION HISTORY:  This is a 55 year old male with known history of  type 1 diabetes mellitus on continuous insulin pump, under the care of  Dr. Kathrynn Humble, endocrinologist at [facility],  obstructive sleep apnea syndrome diagnosed approximately 5 years ago  with formal sleep study, smoking history, dyslipidemia, admitted via  transfer from medicine [facility] for weakness, lethargy,  altered mental status, and uncontrolled glycemia, because of concerns of  possible diabetic ketoacidosis, given a previous history of same. He was  transferred to the medical ICU at [facility] under the care of  the critical care medicine team, i.e. Dr. Shan Levans, who performed  initial evaluation.  He was found to have the following ABGs:  pH 7.387,  bicarbonate 22, anion gap 14.  On arrival, he had already been started  on continuous intravenous infusion of insulin per glucostabilizer  protocol.  On arrival at [facility], his CBG was  found to be  302.  BP was 99/68, temperature was normal at 36.4 degrees centigrade.  Oxygen saturation was 98% on 2 liters of oxygen.  He was diagnosed to  have hyperglycemic hyperosmolar nonketotic syndrome, and was managed  with intravenous fluid hydration, as well as intravenous insulin, and  then following normalization of glycemia, was transitioned to continuous  subcutaneous insulin pump in pre-admission regimen and transferred to  the medical service on November 03, 2008.  The medical service, i.e.  [facility], took over the patient in a.m. of November 04, 2008.  However, in the interim, he had had a number of hypoglycemic episodes  with CBGs as low as 45.   CLINICAL COURSE:  Essentially as outlined above.  In addition:  1. Hyperosmolar hyperglycemic nonketotic state.  This was addressed as      outlined in presenting history above, and clearly, had resolved as      of a.m. of November 04, 2008, with CBGs ranging between 81-105, and      the patient was asymptomatic.   1. Type 1 diabetes mellitus.  The patient as of November 03, 2008,  had      been reinstated on continuous subcutaneous insulin pump per pre-      admission regimen.  He did have a couple of episodes of      hypoglycemia, CBGs ranging around 45.  This was adequately      addressed per protocol with iv D50W, but had resolved as of a.m. of      November 04, 2008.   1. Obstructive sleep apnea syndrome.  The patient has had this      diagnosis for past 5 years, following formal sleep study.  However,      he is not currently on CPAP.  He did mention that some form of      surgery was recommended, but he has not had this.  Be that as it      may, this did not prove problematic during the course of his      hospitalization.   1. Smoking history.  The patient admits to smoking cigars.  He has      been counseled appropriately.   1. Dyslipidemia.  The patient's lipid profile was as follows:  Total       cholesterol 224, triglycerides 120, HDL 36, LDL 164.  He has been      commenced on statin treatment.  We shall defer follow-up, to his      primary M.D.   DISPOSITION:  The patient was on November 04, 2008, considered  sufficiently clinically recovered and stable to be discharged.  There  were no new issues.  He was therefore discharged accordingly.   DIET:  Heart-healthy/carbohydrate modified.   ACTIVITY:  As tolerated.   FOLLOW-UP INSTRUCTIONS:  The patient is to follow up routinely with his  primary M.D., Dr. Maryelizabeth Rowan, in the coming week.  He has been  instructed to call for an appointment.  In addition, he is to follow up  with his primary endocrinologist, Dr. Kathrynn Humble, [address] [place], [place].  He has been instructed to call Dr. Carleene Cooper  office in a.m. of November 06, 2008, to schedule an appointment.  All of  this has been communicated to the patient who verbalized understanding.      Isidor Holts, M.D.  Electronically Signed     CO/MEDQ  D:  11/04/2008  T:  11/04/2008  Job:  [id number]   cc:   Maryelizabeth Rowan, M.D.  Dewayne Hatch, M.D.

## 2011-02-11 ENCOUNTER — Ambulatory Visit (HOSPITAL_COMMUNITY)
Admission: RE | Admit: 2011-02-11 | Discharge: 2011-02-11 | Disposition: A | Discharge status: Home / Self Care | Payer: BC Managed Care – PPO | Source: Ambulatory Visit | Attending: Internal Medicine | Admitting: Internal Medicine

## 2011-02-11 DIAGNOSIS — E119 Type 2 diabetes mellitus without complications: Secondary | ICD-10-CM | POA: Insufficient documentation

## 2011-02-11 DIAGNOSIS — R55 Syncope and collapse: Secondary | ICD-10-CM | POA: Insufficient documentation

## 2011-02-11 DIAGNOSIS — I4510 Unspecified right bundle-branch block: Secondary | ICD-10-CM | POA: Insufficient documentation

## 2011-02-11 DIAGNOSIS — Z7982 Long term (current) use of aspirin: Secondary | ICD-10-CM | POA: Insufficient documentation

## 2011-02-11 DIAGNOSIS — G4733 Obstructive sleep apnea (adult) (pediatric): Secondary | ICD-10-CM | POA: Insufficient documentation

## 2011-02-11 DIAGNOSIS — E876 Hypokalemia: Secondary | ICD-10-CM | POA: Insufficient documentation

## 2011-02-11 DIAGNOSIS — Z794 Long term (current) use of insulin: Secondary | ICD-10-CM | POA: Insufficient documentation

## 2011-02-11 DIAGNOSIS — E785 Hyperlipidemia, unspecified: Secondary | ICD-10-CM | POA: Insufficient documentation

## 2011-02-11 LAB — SURGICAL PCR SCREEN: Staphylococcus aureus: POSITIVE — AB

## 2011-02-11 LAB — GLUCOSE, CAPILLARY: Glucose-Capillary: 268 mg/dL — ABNORMAL HIGH (ref 70–99)

## 2011-02-14 NOTE — Op Note (Signed)
  NAMEGRACIN, MCPARTLAND NO.:  [id number]  MEDICAL RECORD NO.:  [id number]           PATIENT TYPE:  O  LOCATION:  [facility]                         FACILITY:  [facility]  PHYSICIAN:  Hillis Range, MD       DATE OF BIRTH:  [birth year]  DATE OF PROCEDURE: DATE OF DISCHARGE:                              OPERATIVE REPORT   SURGEON:  Hillis Range, MD  PREPROCEDURE DIAGNOSIS:  Syncope.  POSTPROCEDURE DIAGNOSIS:  Syncope.  PROCEDURE:  Implantable loop recorder implantation.  INTRODUCTION:  Mr. Mike Stewart is a very pleasant 55 year old gentleman with a history of diabetes and recurrent, unexplained syncope who presents for implantable loop monitor implantation.  He reports having syncope every few years for at least 10 years.  He is unaware of any triggers or precipitants for syncope.  Episodes are typically of abrupt onset and termination and occur while sitting as well as standing.  He has been found to have normal blood sugars, documented shortly after episodes. He therefore presents today for implantable loop recorder implantation.  DESCRIPTION OF PROCEDURE:  Informed written consent was obtained, and the patient was brought to the electrophysiology lab in the fasting state.  He was adequately sedated with intravenous Versed and fentanyl as outlined in the nursing report.  The patient's chest was evaluated and the adequate site for implantable loop recorder implantation was determined based on surface mapping and extracardiac electrograms observed.  The patient's left chest was then prepped and draped in the usual sterile fashion by the EP lab staff.  An incision on the skin overlying the left parasternal region at approximately the third intercostal space was made.  This incision was 1.5 cm.  Using a combination of sharp and blunt dissection, a left parasternal implantable loop recorder pocket was fashioned using a combination of sharp and blunt dissection.  Electrocautery was  required to assure hemostasis.  Once the pocket was formed, it was irrigated with copious gentamicin solution.  A Medtronic Reveal XT, model [id number] (serial number [id number]) implantable loop recorder was then placed into this subcutaneous pocket.  The pocket was then closed in 2 layers with 2.0 Vicryl suture for the subcutaneous and subcuticular layers.  Steri- Strips and a sterile dressing were then applied.  There were no early apparent complications.  CONCLUSIONS: 1. Successful implantation of a Reveal XT implantable loop recorder     for recurrent, unexplained syncope. 2. No early apparent complications.     Hillis Range, MD     JA/MEDQ  D:  02/11/2011  T:  02/12/2011  Job:  [id number]  cc:   Maryelizabeth Rowan  Electronically Signed by Hillis Range MD on 02/14/2011 05:55:37 PM

## 2012-05-13 ENCOUNTER — Encounter: Payer: Self-pay | Admitting: Internal Medicine

## 2013-02-20 IMAGING — CR DG CHEST 1V PORT
1 series · 1 of 1 positions shown · non-contrast
Comparison: 10/20/2009

CLINICAL DATA: Chest pain.

PORTABLE CHEST - 1 VIEW

[view not recorded]
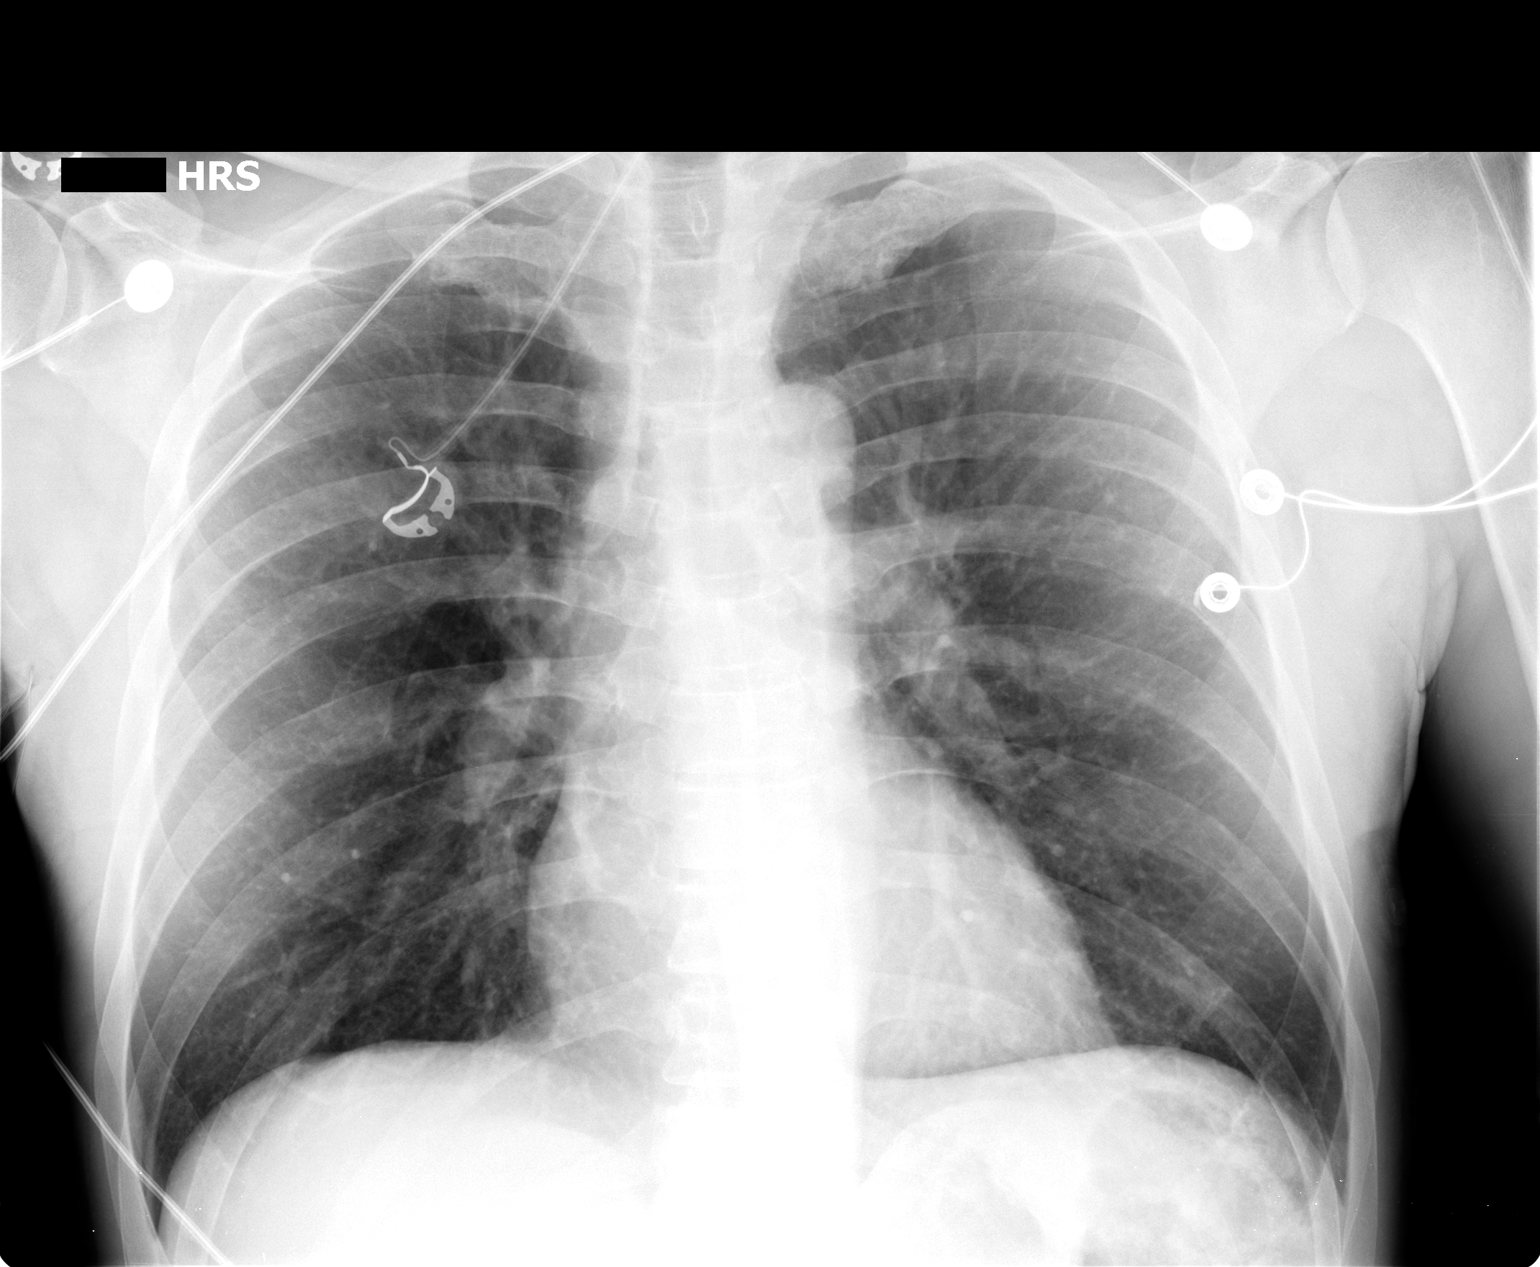

[1 of 1 positions shown; findings below may reference images not displayed]

FINDINGS: The heart size and vascularity are normal and the lungs
are clear.  No significant osseous abnormality.
IMPRESSION: Normal chest.

## 2014-02-21 ENCOUNTER — Encounter: Payer: Self-pay | Admitting: Internal Medicine

## 2015-12-11 DIAGNOSIS — E1169 Type 2 diabetes mellitus with other specified complication: Secondary | ICD-10-CM | POA: Insufficient documentation

## 2015-12-11 DIAGNOSIS — E1065 Type 1 diabetes mellitus with hyperglycemia: Secondary | ICD-10-CM | POA: Insufficient documentation

## 2015-12-11 DIAGNOSIS — E039 Hypothyroidism, unspecified: Secondary | ICD-10-CM | POA: Insufficient documentation

## 2015-12-11 DIAGNOSIS — E1042 Type 1 diabetes mellitus with diabetic polyneuropathy: Secondary | ICD-10-CM | POA: Insufficient documentation

## 2016-04-21 ENCOUNTER — Encounter (HOSPITAL_COMMUNITY): Payer: Self-pay | Admitting: Emergency Medicine

## 2016-04-21 ENCOUNTER — Observation Stay (HOSPITAL_COMMUNITY)
Admission: EM | Admit: 2016-04-21 | Discharge: 2016-04-23 | Disposition: A | Discharge status: Home / Self Care | Payer: 59 | Attending: Internal Medicine | Admitting: Internal Medicine

## 2016-04-21 ENCOUNTER — Observation Stay (HOSPITAL_COMMUNITY): Payer: 59

## 2016-04-21 ENCOUNTER — Emergency Department (HOSPITAL_COMMUNITY): Payer: 59

## 2016-04-21 DIAGNOSIS — I10 Essential (primary) hypertension: Secondary | ICD-10-CM | POA: Diagnosis not present

## 2016-04-21 DIAGNOSIS — R262 Difficulty in walking, not elsewhere classified: Secondary | ICD-10-CM | POA: Diagnosis not present

## 2016-04-21 DIAGNOSIS — R29810 Facial weakness: Secondary | ICD-10-CM | POA: Diagnosis not present

## 2016-04-21 DIAGNOSIS — R269 Unspecified abnormalities of gait and mobility: Secondary | ICD-10-CM | POA: Insufficient documentation

## 2016-04-21 DIAGNOSIS — E162 Hypoglycemia, unspecified: Secondary | ICD-10-CM | POA: Diagnosis present

## 2016-04-21 DIAGNOSIS — I639 Cerebral infarction, unspecified: Secondary | ICD-10-CM | POA: Diagnosis not present

## 2016-04-21 DIAGNOSIS — R531 Weakness: Secondary | ICD-10-CM | POA: Diagnosis not present

## 2016-04-21 DIAGNOSIS — Z794 Long term (current) use of insulin: Secondary | ICD-10-CM | POA: Diagnosis not present

## 2016-04-21 DIAGNOSIS — Z7982 Long term (current) use of aspirin: Secondary | ICD-10-CM | POA: Diagnosis not present

## 2016-04-21 DIAGNOSIS — E119 Type 2 diabetes mellitus without complications: Secondary | ICD-10-CM

## 2016-04-21 DIAGNOSIS — E11649 Type 2 diabetes mellitus with hypoglycemia without coma: Secondary | ICD-10-CM | POA: Insufficient documentation

## 2016-04-21 DIAGNOSIS — R569 Unspecified convulsions: Secondary | ICD-10-CM

## 2016-04-21 DIAGNOSIS — D696 Thrombocytopenia, unspecified: Secondary | ICD-10-CM | POA: Insufficient documentation

## 2016-04-21 DIAGNOSIS — E785 Hyperlipidemia, unspecified: Secondary | ICD-10-CM | POA: Diagnosis not present

## 2016-04-21 DIAGNOSIS — E039 Hypothyroidism, unspecified: Secondary | ICD-10-CM | POA: Diagnosis not present

## 2016-04-21 DIAGNOSIS — R4781 Slurred speech: Secondary | ICD-10-CM | POA: Diagnosis not present

## 2016-04-21 DIAGNOSIS — W06XXXA Fall from bed, initial encounter: Secondary | ICD-10-CM | POA: Diagnosis not present

## 2016-04-21 DIAGNOSIS — G4733 Obstructive sleep apnea (adult) (pediatric): Secondary | ICD-10-CM | POA: Insufficient documentation

## 2016-04-21 DIAGNOSIS — F17290 Nicotine dependence, other tobacco product, uncomplicated: Secondary | ICD-10-CM | POA: Insufficient documentation

## 2016-04-21 DIAGNOSIS — R2990 Unspecified symptoms and signs involving the nervous system: Secondary | ICD-10-CM

## 2016-04-21 DIAGNOSIS — Z720 Tobacco use: Secondary | ICD-10-CM

## 2016-04-21 DIAGNOSIS — G43109 Migraine with aura, not intractable, without status migrainosus: Secondary | ICD-10-CM

## 2016-04-21 DIAGNOSIS — Z9119 Patient's noncompliance with other medical treatment and regimen: Secondary | ICD-10-CM | POA: Diagnosis not present

## 2016-04-21 DIAGNOSIS — G459 Transient cerebral ischemic attack, unspecified: Secondary | ICD-10-CM

## 2016-04-21 DIAGNOSIS — E108 Type 1 diabetes mellitus with unspecified complications: Secondary | ICD-10-CM | POA: Diagnosis not present

## 2016-04-21 DIAGNOSIS — R0602 Shortness of breath: Secondary | ICD-10-CM

## 2016-04-21 LAB — PROTIME-INR
INR: 0.96
Prothrombin Time: 12.8 seconds (ref 11.4–15.2)

## 2016-04-21 LAB — COMPREHENSIVE METABOLIC PANEL
ALK PHOS: 51 U/L (ref 38–126)
ALT: 24 U/L (ref 17–63)
AST: 28 U/L (ref 15–41)
Albumin: 3.6 g/dL (ref 3.5–5.0)
Anion gap: 7 (ref 5–15)
BUN: 16 mg/dL (ref 6–20)
CHLORIDE: 108 mmol/L (ref 101–111)
CO2: 23 mmol/L (ref 22–32)
CREATININE: 0.73 mg/dL (ref 0.61–1.24)
Calcium: 8.8 mg/dL — ABNORMAL LOW (ref 8.9–10.3)
GFR calc Af Amer: >60 mL/min (ref >60)
GFR calc non Af Amer: >60 mL/min (ref >60)
Glucose, Bld: 131 mg/dL — ABNORMAL HIGH (ref 65–99)
Potassium: 3.6 mmol/L (ref 3.5–5.1)
SODIUM: 138 mmol/L (ref 135–145)
Total Bilirubin: 0.8 mg/dL (ref 0.3–1.2)
Total Protein: 5.6 g/dL — ABNORMAL LOW (ref 6.5–8.1)

## 2016-04-21 LAB — GLUCOSE, CAPILLARY
GLUCOSE-CAPILLARY: 278 mg/dL — AB (ref 65–99)
Glucose-Capillary: 94 mg/dL (ref 65–99)
Glucose-Capillary: 197 mg/dL — ABNORMAL HIGH (ref 65–99)

## 2016-04-21 LAB — CBC
HEMATOCRIT: 40.9 % (ref 39.0–52.0)
Hemoglobin: 14.5 g/dL (ref 13.0–17.0)
MCH: 33.8 pg (ref 26.0–34.0)
MCHC: 35.5 g/dL (ref 30.0–36.0)
MCV: 95.3 fL (ref 78.0–100.0)
PLATELETS: 131 10*3/uL — AB (ref 150–400)
RBC: 4.29 MIL/uL (ref 4.22–5.81)
RDW: 12.9 % (ref 11.5–15.5)
WBC: 11.6 10*3/uL — AB (ref 4.0–10.5)

## 2016-04-21 LAB — DIFFERENTIAL
BASOS ABS: 0 10*3/uL (ref 0.0–0.1)
BASOS PCT: 0 %
Eosinophils Absolute: 0.2 10*3/uL (ref 0.0–0.7)
Eosinophils Relative: 2 %
LYMPHS PCT: 13 %
Lymphs Abs: 1.5 10*3/uL (ref 0.7–4.0)
MONOS PCT: 6 %
Monocytes Absolute: 0.7 10*3/uL (ref 0.1–1.0)
NEUTROS ABS: 9.3 10*3/uL — AB (ref 1.7–7.7)
Neutrophils Relative %: 79 %

## 2016-04-21 LAB — RAPID URINE DRUG SCREEN, HOSP PERFORMED
Amphetamines: NOT DETECTED
BARBITURATES: NOT DETECTED
BENZODIAZEPINES: NOT DETECTED
Cocaine: NOT DETECTED
Opiates: NOT DETECTED
Tetrahydrocannabinol: NOT DETECTED

## 2016-04-21 LAB — URINALYSIS, ROUTINE W REFLEX MICROSCOPIC
BILIRUBIN URINE: NEGATIVE
Glucose, UA: >1000 mg/dL — AB
Hgb urine dipstick: NEGATIVE
KETONES UR: NEGATIVE mg/dL
Leukocytes, UA: NEGATIVE
NITRITE: NEGATIVE
Protein, ur: NEGATIVE mg/dL
SPECIFIC GRAVITY, URINE: 1.023 (ref 1.005–1.030)
pH: 6.5 (ref 5.0–8.0)

## 2016-04-21 LAB — APTT: APTT: 26 s (ref 24–36)

## 2016-04-21 LAB — I-STAT TROPONIN, ED: Troponin i, poc: 0 ng/mL (ref 0.00–0.08)

## 2016-04-21 LAB — URINE MICROSCOPIC-ADD ON
Bacteria, UA: NONE SEEN
RBC / HPF: NONE SEEN RBC/hpf (ref 0–5)
SQUAMOUS EPITHELIAL / LPF: NONE SEEN

## 2016-04-21 LAB — I-STAT CHEM 8, ED
BUN: 18 mg/dL (ref 6–20)
CHLORIDE: 105 mmol/L (ref 101–111)
CREATININE: 0.8 mg/dL (ref 0.61–1.24)
Calcium, Ion: 1.16 mmol/L (ref 1.13–1.30)
Glucose, Bld: 128 mg/dL — ABNORMAL HIGH (ref 65–99)
HEMATOCRIT: 43 % (ref 39.0–52.0)
HEMOGLOBIN: 14.6 g/dL (ref 13.0–17.0)
POTASSIUM: 3.6 mmol/L (ref 3.5–5.1)
Sodium: 139 mmol/L (ref 135–145)
TCO2: 22 mmol/L (ref 0–100)

## 2016-04-21 LAB — CBG MONITORING, ED: Glucose-Capillary: 128 mg/dL — ABNORMAL HIGH (ref 65–99)

## 2016-04-21 MED ORDER — SUMATRIPTAN SUCCINATE 50 MG PO TABS
50.0000 mg | ORAL_TABLET | Freq: Three times a day (TID) | ORAL | Status: DC | PRN
  Administered 2016-04-21 – 2016-04-23 (×4): 50 mg via ORAL
  Filled 2016-04-21 (×6): qty 1

## 2016-04-21 MED ORDER — DIPHENHYDRAMINE HCL 25 MG PO CAPS
25.0000 mg | ORAL_CAPSULE | ORAL | Status: DC | PRN
  Administered 2016-04-23: 25 mg via ORAL
  Filled 2016-04-21: qty 1

## 2016-04-21 MED ORDER — ASPIRIN 300 MG RE SUPP: 300.0000 mg | Freq: Every day | RECTAL | Status: DC

## 2016-04-21 MED ORDER — SODIUM CHLORIDE 0.9 % IV SOLN: 25.0000 mg | INTRAVENOUS | Status: DC | PRN

## 2016-04-21 MED ORDER — ACETAMINOPHEN 325 MG PO TABS
650.0000 mg | ORAL_TABLET | Freq: Four times a day (QID) | ORAL | Status: DC | PRN
  Administered 2016-04-21 – 2016-04-22 (×2): 650 mg via ORAL
  Filled 2016-04-21 (×2): qty 2

## 2016-04-21 MED ORDER — TRAMADOL HCL 50 MG PO TABS
50.0000 mg | ORAL_TABLET | Freq: Four times a day (QID) | ORAL | Status: DC | PRN
  Administered 2016-04-21 – 2016-04-23 (×5): 50 mg via ORAL
  Filled 2016-04-21 (×5): qty 1

## 2016-04-21 MED ORDER — ATORVASTATIN CALCIUM 40 MG PO TABS
40.0000 mg | ORAL_TABLET | Freq: Every day | ORAL | Status: DC
  Administered 2016-04-22: 40 mg via ORAL
  Filled 2016-04-21: qty 1

## 2016-04-21 MED ORDER — ENOXAPARIN SODIUM 40 MG/0.4ML ~~LOC~~ SOLN
40.0000 mg | SUBCUTANEOUS | Status: DC
Start: 2016-04-21 — End: 2016-04-23
  Administered 2016-04-21 – 2016-04-23 (×3): 40 mg via SUBCUTANEOUS
  Filled 2016-04-21 (×3): qty 0.4

## 2016-04-21 MED ORDER — METOCLOPRAMIDE HCL 5 MG/ML IJ SOLN
10.0000 mg | Freq: Once | INTRAMUSCULAR | Status: AC
  Administered 2016-04-21: 10 mg via INTRAVENOUS
  Filled 2016-04-21: qty 2

## 2016-04-21 MED ORDER — NICOTINE 21 MG/24HR TD PT24
21.0000 mg | MEDICATED_PATCH | Freq: Every day | TRANSDERMAL | Status: DC
  Administered 2016-04-21: 21 mg via TRANSDERMAL
  Filled 2016-04-21 (×2): qty 1

## 2016-04-21 MED ORDER — KETOROLAC TROMETHAMINE 30 MG/ML IJ SOLN
30.0000 mg | Freq: Once | INTRAMUSCULAR | Status: AC
  Administered 2016-04-21: 30 mg via INTRAVENOUS
  Filled 2016-04-21: qty 1

## 2016-04-21 MED ORDER — STROKE: EARLY STAGES OF RECOVERY BOOK
Freq: Once | Status: AC
  Administered 2016-04-21: 07:00:00
  Filled 2016-04-21: qty 1

## 2016-04-21 MED ORDER — SODIUM CHLORIDE 0.9 % IV SOLN
INTRAVENOUS | Status: DC
  Administered 2016-04-22: 13:00:00 via INTRAVENOUS

## 2016-04-21 MED ORDER — INSULIN ASPART 100 UNIT/ML ~~LOC~~ SOLN
0.0000 [IU] | Freq: Every day | SUBCUTANEOUS | Status: DC
  Administered 2016-04-21: 5 [IU] via SUBCUTANEOUS

## 2016-04-21 MED ORDER — SENNOSIDES-DOCUSATE SODIUM 8.6-50 MG PO TABS: 1.0000 | ORAL_TABLET | Freq: Every evening | ORAL | Status: DC | PRN

## 2016-04-21 MED ORDER — ACETAMINOPHEN 325 MG PO TABS
650.0000 mg | ORAL_TABLET | Freq: Once | ORAL | Status: AC
  Administered 2016-04-21: 650 mg via ORAL
  Filled 2016-04-21: qty 2

## 2016-04-21 MED ORDER — DEXTROSE 50 % IV SOLN: 50.0000 mL | INTRAVENOUS | Status: DC | PRN

## 2016-04-21 MED ORDER — DIPHENHYDRAMINE HCL 50 MG/ML IJ SOLN
25.0000 mg | INTRAMUSCULAR | Status: DC | PRN
  Administered 2016-04-21: 25 mg via INTRAVENOUS
  Filled 2016-04-21: qty 1

## 2016-04-21 MED ORDER — INSULIN ASPART 100 UNIT/ML ~~LOC~~ SOLN
0.0000 [IU] | Freq: Three times a day (TID) | SUBCUTANEOUS | Status: DC
  Administered 2016-04-21: 5 [IU] via SUBCUTANEOUS
  Administered 2016-04-21: 2 [IU] via SUBCUTANEOUS
  Administered 2016-04-22: 7 [IU] via SUBCUTANEOUS
  Administered 2016-04-22: 9 [IU] via SUBCUTANEOUS
  Administered 2016-04-22 – 2016-04-23 (×2): 3 [IU] via SUBCUTANEOUS

## 2016-04-21 MED ORDER — ASPIRIN 325 MG PO TABS
325.0000 mg | ORAL_TABLET | Freq: Every day | ORAL | Status: DC
  Administered 2016-04-21 – 2016-04-23 (×3): 325 mg via ORAL
  Filled 2016-04-21 (×3): qty 1

## 2016-04-21 MED ORDER — SODIUM CHLORIDE 0.9 % IV SOLN: INTRAVENOUS | Status: DC

## 2016-04-21 NOTE — Progress Notes (Signed)
Patient c/o headache. Has order for tylenol, but said it does not help. MD notified.

## 2016-04-21 NOTE — Progress Notes (Addendum)
SLP Cancellation Note  Patient Details Name: Mike Stewart MRN: [id number] DOB: 12-24-1955   Cancelled treatment:       Reason Eval/Treat Not Completed: Other (comment). Received order for bedside swallow eval. Pt passed RN stroke swallow screen, and RN does not have concerns with swallowing or speech/ language/ cognition. Pt initially had slurred speech but reportedly this has resolved. Head CT negative. SLP will sign off. Please re-order bedside swallow or cognitive-linguistic eval if concerns arise. Thank you.   Metro Kung, MA, CCC-SLP 04/21/2016, 9:48 AM [phone]

## 2016-04-21 NOTE — Evaluation (Signed)
Occupational Therapy Evaluation Patient Details Name: Mike Stewart MRN: [id number] DOB: 11-06-1955 Today's Date: 04/21/2016    History of Present Illness Mike Stewart a 60 y.o.malewith medical history significant of hyperlipidemia, diabetes mellitus, OSA not using CPAP, syncope, right bundle blockage, tobacco abuse, s/p of heart monitor placement, who was admitted with slurred speech, right facial droop, right arm and leg numbness and weakness.   Clinical Impression   Patient evaluated by Occupational Therapy with no further acute OT needs identified. All education has been completed and the patient has no further questions. See below for any follow-up Occupational Therapy or equipment needs. OT to sign off. Thank you for referral.   Balance deficits noted and advised not to return to climbing ladders and stairs until cleared by MD or PT. Pt with noticeable drift to the R with transfer. Pt able to self correct and reaching for environmental supports.     Follow Up Recommendations  No OT follow up    Equipment Recommendations  None recommended by OT    Recommendations for Other Services PT consult (outpatient)     Precautions / Restrictions Precautions Precautions: Fall      Mobility Bed Mobility Overal bed mobility: Independent                Transfers Overall transfer level: Independent                    Balance                                     Dynamic Gait Index Change in Gait Speed: Mild Impairment Gait with Horizontal Head Turns: Mild Impairment Step Over Obstacle: Mild Impairment Step Around Obstacles: Mild Impairment      ADL Overall ADL's : Independent                                       General ADL Comments: Pt demonstrates a slight drift to the R with transfers. pt with below gait velocity during session     Vision     Perception     Praxis      Pertinent Vitals/Pain Pain Assessment:  No/denies pain     Hand Dominance Left   Extremity/Trunk Assessment Upper Extremity Assessment Upper Extremity Assessment: RUE deficits/detail RUE Deficits / Details: reports numbness in digits and palm only pt reports sensation normal starting at wrist. pt able to functionally use UE this session without deficits   Lower Extremity Assessment Lower Extremity Assessment: Defer to PT evaluation   Cervical / Trunk Assessment Cervical / Trunk Assessment: Normal   Communication Communication Communication: No difficulties   Cognition Arousal/Alertness: Awake/alert Behavior During Therapy: WFL for tasks assessed/performed Overall Cognitive Status: Within Functional Limits for tasks assessed                     General Comments       Exercises       Shoulder Instructions      Home Living Family/patient expects to be discharged to:: Private residence Living Arrangements: Spouse/significant other Available Help at Discharge: Family Type of Home: House Home Access: Stairs to enter Secretary/administrator of Steps: 3 Entrance Stairs-Rails: Left Home Layout: One level     Bathroom Shower/Tub: Producer, television/film/video: Standard  Home Equipment: None          Prior Functioning/Environment Level of Independence: Independent        Comments: 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    OT Diagnosis:     OT Problem List:     OT Treatment/Interventions:      OT Goals(Current goals can be found in the care plan section) Acute Rehab OT Goals Patient Stated Goal: to return to work Wednesday OT Goal Formulation: With patient/family  OT Frequency:     Barriers to D/C:            Co-evaluation              End of Session Equipment Utilized During Treatment: Gait belt Nurse Communication: Mobility status;Precautions  Activity Tolerance: Patient tolerated treatment well Patient  left: in bed;with call bell/phone within reach;with family/visitor present   Time: 1355-1410 ([id number]) OT Time Calculation (min): 15 min Charges:  OT General Charges $OT Visit: 1 Procedure OT Evaluation $OT Eval Moderate Complexity: 1 Procedure G-Codes: OT G-codes **NOT FOR INPATIENT CLASS** Functional Assessment Tool Used: clinical judgement Functional Limitation: Self care Self Care Current Status (O9629): At least 1 percent but less than 20 percent impaired, limited or restricted Self Care Goal Status (B2841): At least 1 percent but less than 20 percent impaired, limited or restricted Self Care Discharge Status [phone]): At least 1 percent but less than 20 percent impaired, limited or restricted  Harolyn Rutherford 04/21/2016, 2:23 PM   Mateo Flow   OTR/L Pager: [phone] Office: [phone] .

## 2016-04-21 NOTE — Code Documentation (Signed)
A code stroke was called on this 60 y/o white male pt who was LSW at 2200 hrs on 04/20/2016 when he went to sleep.  He awoke at 0255, sat up on the edge of the bed and fell.  His wife noted his speech to be garbled and he had a CBG of 35.  Upon arrival EMS pt was noted to have right facial and right arm numbness and right leg weakness as well as a right facial droop.  Pt's repeat CBG was 152 after receiving Oral glucose and orange juice. Upon arrival to [facility] at 0345 pt speech was clear and he had no facial droop. He was cleared for CT by Dr Preston Fleeting at [phone] with arrival to CT scanner at 0350.  Upon return to  Trauma C  pt scored a 1 on the NIHSS given for sensory loss on right side.  The CT results of no acute intracranial pathology was called to Dr Roseanne Reno at [phone]. TPA not given due to out of window and symptoms resolving.  Admitted by medicine service to [phone]

## 2016-04-21 NOTE — Progress Notes (Signed)
STROKE TEAM PROGRESS NOTE   HISTORY OF PRESENT ILLNESS (per record) Mike Stewart is an 59 y.o. male history diabetes mellitus as well as hyperlipidemia and obstructive sleep apnea, brought to the emergency room and code stroke status for slurred speech and weakness on his right side, as well as altered mental status. Patient was noted to initially have blood sugar of 35. He was given D50 IV and blood sugar rose to 150. He continued to have slurred speech and mild confusion as well as weakness of his right extremities. There's no previous history of stroke nor TIA. He has not been on antiplatelet therapy. CT scan of his head showed no acute abnormality. Weakness improved, and slurred speech resolved. He also had residual right-sided weakness. NIH stroke score was 1 at the time of this evaluation. He was LKW at 10 PM on 04/20/2016. Patient was not administered IV t-PA secondary to beyond time window for treatment consideration; minimal residual deficits. He was admitted for further evaluation and treatment.   SUBJECTIVE (INTERVAL HISTORY) Wife, son, daughter in law are all at bedside. Wife stated that pt had multiple GTC seizures associated with hypoglycemia in the past, at least 25 times. However, about 2 weeks ago, pt started to have episodic strange behavior without self awareness, lasting one hour followed with confusion, severe HA and lethargy, glucose reported WNL. Wife also reported staring spells 10-15 each, 3-4 every week, did not check glucose level. This admission with fall off bed, right sided weakness, not able to get up after, found to have glucose 35, after treatment, glucose 150 and symptoms gradually resolved.    OBJECTIVE Temp:  [97.9 F (36.6 C)-98.2 F (36.8 C)] 98.2 F (36.8 C) (07/31 1400) Pulse Rate:  [66-78] 72 (07/31 1400) Cardiac Rhythm: Normal sinus rhythm (07/31 0800) Resp:  [16-18] 16 (07/31 1400) BP: (92-136)/(56-77) 120/72 (07/31 1400) SpO2:  [97 %-100 %] 99 % (07/31  1400) Weight:  [83.5 kg (184 lb 1.4 oz)] 83.5 kg (184 lb 1.4 oz) (07/31 0401)  CBC:  Recent Labs Lab 04/21/16 0350 04/21/16 0355  WBC 11.6*  --   NEUTROABS 9.3*  --   HGB 14.5 14.6  HCT 40.9 43.0  MCV 95.3  --   PLT 131*  --     Basic Metabolic Panel:  Recent Labs Lab 04/21/16 0350 04/21/16 0355  NA 138 139  K 3.6 3.6  CL 108 105  CO2 23  --   GLUCOSE 131* 128*  BUN 16 18  CREATININE 0.73 0.80  CALCIUM 8.8*  --     Lipid Panel:     Component Value Date/Time   CHOL (H) 12/30/2010 0417    211        ATP III CLASSIFICATION:  <200     mg/dL   Desirable  409-811  mg/dL   Borderline High  >=914    mg/dL   High          TRIG 782 12/30/2010 0417   HDL 48 12/30/2010 0417   CHOLHDL 4.4 12/30/2010 0417   VLDL 25 12/30/2010 0417   LDLCALC (H) 12/30/2010 0417    138        Total Cholesterol/HDL:CHD Risk Coronary Heart Disease Risk Table                     Men   Women  1/2 Average Risk   3.4   3.3  Average Risk       5.0   4.4  2 X Average Risk   9.6   7.1  3 X Average Risk  23.4   11.0        Use the calculated Patient Ratio above and the CHD Risk Table to determine the patient's CHD Risk.        ATP III CLASSIFICATION (LDL):  <100     mg/dL   Optimal  771-165  mg/dL   Near or Above                    Optimal  130-159  mg/dL   Borderline  790-383  mg/dL   High  >338     mg/dL   Very High   VANV9T:  Lab Results  Component Value Date   HGBA1C (H) 12/29/2010    11.5 (NOTE)                                                                       According to the ADA Clinical Practice Recommendations for 2011, when HbA1c is used as a screening test:   >=6.5%   Diagnostic of Diabetes Mellitus           (if abnormal result  is confirmed)  5.7-6.4%   Increased risk of developing Diabetes Mellitus  References:Diagnosis and Classification of Diabetes Mellitus,Diabetes Care,2011,34(Suppl 1):S62-S69 and Standards of Medical Care in         Diabetes - 2011,Diabetes  Care,2011,34  (Suppl 1):S11-S61.   Urine Drug Screen:    Component Value Date/Time   LABOPIA NONE DETECTED 04/21/2016 1330   COCAINSCRNUR NONE DETECTED 04/21/2016 1330   LABBENZ NONE DETECTED 04/21/2016 1330   AMPHETMU NONE DETECTED 04/21/2016 1330   THCU NONE DETECTED 04/21/2016 1330   LABBARB NONE DETECTED 04/21/2016 1330      IMAGING I have personally reviewed the radiological images below and agree with the radiology interpretations.  Ct Head Code Stroke W/o Cm 04/21/2016 No acute intracranial pathology.   MRI HEAD  04/21/2016 No acute infarct or intracranial hemorrhage. Mild chronic microvascular changes unchanged from prior exam. Persistent opacification left middle ear and left mastoid air cells without obstructing lesion of the eustachian tube noted. Minimal mucosal thickening paranasal sinuses.   MRA HEAD  04/21/2016 Mild narrowing proximal cavernous segment left internal carotid artery. Mild narrowing supraclinoid segment right internal carotid artery. Slightly hypoplastic A1 segment right anterior cerebral artery. Middle cerebral artery mild branch vessel irregularity and mild narrowing bilaterally. Mild narrowing distal right vertebral artery. Nonvisualized right posterior inferior cerebellar artery. Mild to moderate narrowing proximal left posterior inferior cerebellar artery. Poor delineation of majority of the anterior inferior cerebellar arteries. No significant narrowing of the basilar artery.   EEG pending  CUS pending  TTE pending   PHYSICAL EXAM  Temp:  [97.9 F (36.6 C)-98.2 F (36.8 C)] 98 F (36.7 C) (07/31 2108) Pulse Rate:  [66-78] 69 (07/31 2108) Resp:  [16-18] 16 (07/31 2108) BP: (92-136)/(56-77) 120/69 (07/31 2108) SpO2:  [96 %-100 %] 96 % (07/31 2108) Weight:  [184 lb 1.4 oz (83.5 kg)] 184 lb 1.4 oz (83.5 kg) (07/31 0401)  General - Well nourished, well developed, in no apparent distress.  Ophthalmologic - Sharp disc margins  OU.  Cardiovascular - Regular rate and rhythm.  Mental Status -  Level of arousal and orientation to time, place, and person were intact. Language including expression, naming, repetition, comprehension was assessed and found intact. Fund of Knowledge was assessed and was intact.  Cranial Nerves II - XII - II - Visual field intact OU. III, IV, VI - Extraocular movements intact. V - Facial sensation intact bilaterally. VII - Facial movement intact bilaterally. VIII - Hearing & vestibular intact bilaterally. X - Palate elevates symmetrically. XI - Chin turning & shoulder shrug intact bilaterally. XII - Tongue protrusion intact.  Motor Strength - The patient's strength was normal in all extremities and pronator drift was absent.  Bulk was normal and fasciculations were absent.   Motor Tone - Muscle tone was assessed at the neck and appendages and was normal.  Reflexes - The patient's reflexes were 1+ in all extremities and he had no pathological reflexes.  Sensory - Light touch, temperature/pinprick were assessed and were symmetrical.    Coordination - The patient had normal movements in the hands and feet with no ataxia or dysmetria.  Tremor was absent.  Gait and Station - The patient's transfers, posture, gait, station, and turns were observed as normal.   ASSESSMENT/PLAN Mr. Mike Stewart is a 60 y.o. male with history of DM, HLD, migraine and OSA presenting with altered mental status, slurred speech and R sided weakness in the setting of hypoglycemia. He did not receive IV t-PA due to beyond time treatment for consideration, minimal residual deficits..   Focal neurological deficit associated with hypoglycemia  Resultant - fall, right sided weakness, dysarthria - now resolved  MRI  No acute stroke.  MRA  unremarkable  Carotid Doppler  pending  2D Echo  pending  LDL pending   HgbA1c pending  Lovenox 40 mg sq daily for VTE prophylaxis  Diet Carb Modified Fluid  consistency: Thin; Room service appropriate? Yes  No antithrombotic prior to admission, now on aspirin 325 mg daily. Continue ASA on discharge.  Patient counseled to be compliant with his antithrombotic medications  Ongoing aggressive stroke risk factor management  Therapy recommendations:  OP PT, no OT  Disposition:  Return home  ? Seizure vs. Migraine aura  Hx of GTC with profound hypoglycemia episodes  Strange behavior without awareness followed by confusion, extreme HA and lethargy  Staring spells 3-4 times per week  EEG pending  On imitrex and tramadol  Consider depakote if EEG negative for both seizure and migraine prevention.   Hyperlipidemia  Home meds:  lipitor 40  LDL pending, goal < 70  lipitor  resumed   Diabetes with Hypoglycemia   HgbA1c pending, goal < 7.0  On SSI  CBG monitoring  Tobacco abuse  Current cigar smoker  Smoking cessation counseling provided  Nicotine patch provided  Pt is willing to quit  Other Stroke Risk Factors  ETOH use, advised to drink no more than 2 drink(s) a day  Obstructive sleep apnea, not compliant with CPAP at home  Other Active Problems  Leukocytosis without signs of infection  Hospital day # 0   Marvel Plan, MD PhD Stroke Neurology 04/21/2016 11:24 PM   To contact Stroke Continuity provider, please refer to [email or web address]. After hours, contact General Neurology

## 2016-04-21 NOTE — H&P (Signed)
History and Physical    Carrel Calfee HWK:[id number] DOB: [birth year] DOA: 04/21/2016  Referring MD/NP/PA:   PCP: Maryelizabeth Rowan, MD   Patient coming from:  The patient is coming from home.  At baseline, pt is independent for most of ADL.   Chief Complaint: Slurred speech, right facial droop, right arm and leg numbness and weakness.  HPI: Mike Stewart is a 60 y.o. male with medical history significant of hyperlipidemia, diabetes mellitus, OSA not using CPAP, syncope, right bundle blockage, tobacco abuse, s/p of heart monitor placement, who presents with slurred speech, right facial droop, right arm and leg numbness and weakness.  Per report, pt woke up at 0255 AM and sat up to get out of bed and fell forward landing on the floor. Pt was noticed by his wife to have slurred speech, right facial droop. Pt reports right arm and leg numbness and weakness. No vision change or hearing loss. Patient was noted to initially have blood sugar of 35. He was given D50 IV and blood sugar rose to 150. He states that injected 4 units of Novolog last night after measured his blood sugar which was ~240, but he also states that he could have wrongly injected higher dose of insulin. Pt has mild confusion, but oriented x 3. Patient denies chest pain, shortness of breath, cough, fever, chills, nausea, vomiting, abdominal pain, symptoms of UTI. His weakness improved and slurred speech resolved, but still has right arm and leg weakness and numbness in the emergency room. Of note, pt states that he has heart monitor which was placed 3 years ago. The heart monitor is compatible to MRI. He had an MRI last month in [place] per pt which is confirmed with his wife.  ED Course: pt was found to have WBC 7.6, INR 0.96, PTT 26, negative troponin, temperature normal, no tachycardia, no tachypnea, electrolytes and renal function okay. CT-head negative for acute intracranial abnormalities. Patient is placed on telemetry bed for  observation. Neurology, Dr. Roseanne Reno was consulted.  Review of Systems:   General: no fevers, chills, no changes in body weight, has fatigue HEENT: no blurry vision, hearing changes or sore throat Pulm: no dyspnea, coughing, wheezing CV: no chest pain, no palpitations Abd: no nausea, vomiting, abdominal pain, diarrhea, constipation GU: no dysuria, burning on urination, increased urinary frequency, hematuria  Ext: no leg edema Neuro: has right sided weakness, numbness and R facial droop and slurred speech. No vision change or hearing loss Skin: no rash MSK: No muscle spasm, no deformity, no limitation of range of movement in spin Heme: No easy bruising.  Travel history: No recent long distant travel.  Allergy:  Allergies  Allergen Reactions  . Omnipaque [Iohexol]     Patient can be pre-medicated  . Phenobarbital     Past Medical History:  Diagnosis Date  . Diabetes mellitus    dx 14 years ago  . Hyperkalemia   . Hyperlipidemia   . Obstructive sleep apnea    not compliant with CPAP  . RBBB (right bundle branch block)   . Syncope    recurrent unexplained syncope    Past Surgical History:  Procedure Laterality Date  . CARDIAC CATHETERIZATION    . HERNIA REPAIR      Social History:  reports that he has been smoking Cigars.  He does not have any smokeless tobacco history on file. He reports that he drinks alcohol. He reports that he does not use drugs.  Family History:  Family History  Problem Relation  Age of Onset  . Adopted: Yes     Prior to Admission medications   Medication Sig Start Date End Date Taking? Authorizing Provider  aspirin 81 MG tablet Take 81 mg by mouth daily.      Historical Provider, MD  insulin aspart (NOVOLOG) 100 UNIT/ML injection As directed     Historical Provider, MD    Physical Exam: Vitals:   04/21/16 0401  BP: 136/77  Pulse: 78  Resp: 16  Temp: 97.9 F (36.6 C)  TempSrc: Oral  SpO2: 97%  Weight: 83.5 kg (184 lb 1.4 oz)    Height:  (1.778 m)   General: Not in acute distress HEENT:       Eyes: PERRL, EOMI, no scleral icterus.       ENT: No discharge from the ears and nose, no pharynx injection, no tonsillar enlargement.        Neck: No JVD, no bruit, no mass felt. Heme: No neck lymph node enlargement. Cardiac: S1/S2, RRR, No murmurs, No gallops or rubs. Pulm: No rales, wheezing, rhonchi or rubs. Abd: Soft, nondistended, nontender, no rebound pain, no organomegaly, BS present. GU: No hematuria Ext: No pitting leg edema bilaterally. 2+DP/PT pulse bilaterally. Musculoskeletal: No joint deformities, No joint redness or warmth, no limitation of ROM in spin. Skin: No rashes.  Neuro: mildly confused, but oriented X3, cranial nerves II-XII grossly intact, moves all extremities normally. Muscle strength 3/5 in R arm and leg, 5/5 in left extremities, has decreased sensation to light touch intact in right arm and leg. Brachial reflex 2+ bilaterally. Negative Babinski's sign.  Psych: Patient is not psychotic, no suicidal or hemocidal ideation.  Labs on Admission: I have personally reviewed following labs and imaging studies  CBC:  Recent Labs Lab 04/21/16 0350 04/21/16 0355  WBC 11.6*  --   NEUTROABS 9.3*  --   HGB 14.5 14.6  HCT 40.9 43.0  MCV 95.3  --   PLT 131*  --    Basic Metabolic Panel:  Recent Labs Lab 04/21/16 0350 04/21/16 0355  NA 138 139  K 3.6 3.6  CL 108 105  CO2 23  --   GLUCOSE 131* 128*  BUN 16 18  CREATININE 0.73 0.80  CALCIUM 8.8*  --    GFR: Estimated Creatinine Clearance: 105.2 mL/min (by C-G formula based on SCr of 0.8 mg/dL). Liver Function Tests:  Recent Labs Lab 04/21/16 0350  AST 28  ALT 24  ALKPHOS 51  BILITOT 0.8  PROT 5.6*  ALBUMIN 3.6   No results for input(s): LIPASE, AMYLASE in the last 168 hours. No results for input(s): AMMONIA in the last 168 hours. Coagulation Profile:  Recent Labs Lab 04/21/16 0350  INR 0.96   Cardiac Enzymes: No  results for input(s): CKTOTAL, CKMB, CKMBINDEX, TROPONINI in the last 168 hours. BNP (last 3 results) No results for input(s): PROBNP in the last 8760 hours. HbA1C: No results for input(s): HGBA1C in the last 72 hours. CBG:  Recent Labs Lab 04/21/16 0347  GLUCAP 128*   Lipid Profile: No results for input(s): CHOL, HDL, LDLCALC, TRIG, CHOLHDL, LDLDIRECT in the last 72 hours. Thyroid Function Tests: No results for input(s): TSH, T4TOTAL, FREET4, T3FREE, THYROIDAB in the last 72 hours. Anemia Panel: No results for input(s): VITAMINB12, FOLATE, FERRITIN, TIBC, IRON, RETICCTPCT in the last 72 hours. Urine analysis:    Component Value Date/Time   COLORURINE YELLOW 07/04/2010 1520   APPEARANCEUR CLEAR 07/04/2010 1520   LABSPEC 1.041 (H) 07/04/2010 1520  PHURINE 6.0 07/04/2010 1520   GLUCOSEU >1000 (A) 07/04/2010 1520   HGBUR NEGATIVE 07/04/2010 1520   BILIRUBINUR NEGATIVE 07/04/2010 1520   KETONESUR 40 (A) 07/04/2010 1520   PROTEINUR NEGATIVE 07/04/2010 1520   UROBILINOGEN 0.2 07/04/2010 1520   NITRITE NEGATIVE 07/04/2010 1520   LEUKOCYTESUR NEGATIVE 07/04/2010 1520   Sepsis Labs: @LABRCNTIP (procalcitonin:4,lacticidven:4) )No results found for this or any previous visit (from the past 240 hour(s)).   Radiological Exams on Admission: Ct Head Code Stroke W/o Cm  Result Date: 04/21/2016 CLINICAL DATA:  60 year old male with code stroke. EXAM: CT HEAD WITHOUT CONTRAST TECHNIQUE: Contiguous axial images were obtained from the base of the skull through the vertex without intravenous contrast. COMPARISON:  Head CT dated 10/20/2009 and brain MRI dated 04/02/2016 FINDINGS: The ventricles and the sulci are appropriate in size for the patient's age. There is no intracranial hemorrhage. No midline shift or mass effect identified. The gray-white matter differentiation is preserved. The visualized paranasal sinuses and mastoid air cells are well aerated. The calvarium is intact. IMPRESSION: No  acute intracranial pathology. These results were called by telephone at the time of interpretation on 04/21/2016 at 4:07 am to Dr. Roseanne Reno, who verbally acknowledged these results. Electronically Signed   By: Elgie Collard M.D.   On: 04/21/2016 04:09    EKG: Independently reviewed. Sinus rhythm, QTC 464, ?RBBB.  Assessment/Plan Principal Problem:   Stroke [facility]) Active Problems:   HLD (hyperlipidemia)   Hypoglycemia   Diabetes mellitus without complication (HCC)   Tobacco abuse   Stroke [facility]): pt's symptoms are concerning for stroke. CT head is negative for acute intracranial abnormalities. Neurology, Dr. Roseanne Reno was consulted, recommended stroke workup.  -will place on tele bed for obs -highly appreciate Dr. Roseanne Reno consultation, with follow-up recommendations as follows:  1. HgbA1c, fasting lipid panel  2. MRI, MRA  of the brain without contrast  3. PT consult, OT consult, Speech consult  4. Echocardiogram  5. Carotid dopplers  6. Prophylactic therapy-Antiplatelet med: Aspirin   7. Risk factor modification  8. Telemetry monitoring  -tPA was not given: beyond time under for treatment consideration; minimal residual deficits -No A Fib present -check UDS  HLD: Last LDL was 138 on 12/30/10 -Continue home medications: Lipitor   Tobacco abuse: -Did counseling about importance of quitting smoking -Nicotine patch  Hypoglycemia: Etiology is not clear. He states that injected 4 units of Novolog last night after measured his blood sugar which was ~240, but he also states that he could have wrongly injected higher dose of insulin, which could be the reason. Now his blood sugar is 128. -will check CBG q1h -prn D50  Diabetes mellitus without complication (HCC): Last A1c 11.5 on 12/29/10, poorly controled. Patient is taking sliding scale NovoLog at home.  -SSI -Check A1c  Leukocytosis: no signs of infection. Likely due to stress induced to demargination. -follow up by  CBC -check UA -will get blood culture if develops fever (not ordered yet)   DVT ppx: sQ Lovenox Code Status: Full code Family Communication: Yes, patient's wife and son at bed side Disposition Plan:  Anticipate discharge back to previous home environment Consults called:  Neuro, Dr. Roseanne Reno Admission status: Obs / tele    Date of Service 04/21/2016    Lorretta Harp [facility] Pager [phone]  If 7PM-7AM, please contact night-coverage [email or web address] Password [email or web address] 04/21/2016, 5:12 AM

## 2016-04-21 NOTE — Consult Note (Signed)
Admission H&P    Chief Complaint: Altered mental status, slurred speech and right-sided weakness.  HPI: Mike Stewart is an 60 y.o. male history diabetes mellitus as well as hyperlipidemia and obstructive sleep apnea, brought to the emergency room and code stroke status for slurred speech and weakness on his right side, as well as altered mental status. Patient was noted to initially have blood sugar of 35. He was given D50 IV and blood sugar rose to 150. He continued to have slurred speech and mild confusion as well as weakness of his right extremities. There's no previous history of stroke nor TIA. He has not been on antiplatelet therapy. CT scan of his head showed no acute abnormality. Weakness improved, and slurred speech resolved. He also had residual right-sided weakness. NIH stroke score was 1 at the time of this evaluation.  LSN: 10 PM on 04/20/2016 tPA Given: No: Beyond time under for treatment consideration; minimal residual deficits mRankin:  Past Medical History:  Diagnosis Date  . Diabetes mellitus    dx 14 years ago  . Hyperkalemia   . Hyperlipidemia   . Obstructive sleep apnea    not compliant with CPAP  . RBBB (right bundle branch block)   . Syncope    recurrent unexplained syncope    Past Surgical History:  Procedure Laterality Date  . CARDIAC CATHETERIZATION    . HERNIA REPAIR      Family History  Problem Relation Age of Onset  . Adopted: Yes   Social History:  reports that he has been smoking Cigars.  He does not have any smokeless tobacco history on file. He reports that he drinks alcohol. He reports that he does not use drugs.  Allergies:  Allergies  Allergen Reactions  . Omnipaque [Iohexol]     Patient can be pre-medicated  . Phenobarbital     Medications: Preadmission medications were reviewed by me.  ROS: History obtained from the patient  General ROS: negative for - chills, fatigue, fever, night sweats, weight gain or weight loss Psychological  ROS: negative for - behavioral disorder, hallucinations, memory difficulties, mood swings or suicidal ideation Ophthalmic ROS: negative for - blurry vision, double vision, eye pain or loss of vision ENT ROS: negative for - epistaxis, nasal discharge, oral lesions, sore throat, tinnitus or vertigo Allergy and Immunology ROS: negative for - hives or itchy/watery eyes Hematological and Lymphatic ROS: negative for - bleeding problems, bruising or swollen lymph nodes Endocrine ROS: negative for - galactorrhea, hair pattern changes, polydipsia/polyuria or temperature intolerance Respiratory ROS: negative for - cough, hemoptysis, shortness of breath or wheezing Cardiovascular ROS: negative for - chest pain, dyspnea on exertion, edema or irregular heartbeat Gastrointestinal ROS: negative for - abdominal pain, diarrhea, hematemesis, nausea/vomiting or stool incontinence Genito-Urinary ROS: negative for - dysuria, hematuria, incontinence or urinary frequency/urgency Musculoskeletal ROS: negative for - joint swelling or muscular weakness Neurological ROS: as noted in HPI Dermatological ROS: negative for rash and skin lesion changes  Physical Examination: Blood pressure 136/77, pulse 78, temperature 97.9 F (36.6 C), temperature source Oral, resp. rate 16, height  (1.778 m), weight 83.5 kg (184 lb 1.4 oz), SpO2 97 %.  HEENT-  Normocephalic, no lesions, without obvious abnormality.  Normal external eye and conjunctiva.  Normal TM's bilaterally.  Normal auditory canals and external ears. Normal external nose, mucus membranes and septum.  Normal pharynx. Neck supple with no masses, nodes, nodules or enlargement. Cardiovascular - regular rate and rhythm, S1, S2 normal, no murmur, click, rub or  gallop Lungs - chest clear, no wheezing, rales, normal symmetric air entry Abdomen - soft, non-tender; bowel sounds normal; no masses,  no organomegaly Extremities - no joint deformities, effusion, or inflammation  and no edema  Neurologic Examination: Mental Status: Alert, oriented, complaining of headache.  Speech fluent without evidence of aphasia. Able to follow commands without difficulty. Cranial Nerves: II-Visual fields were normal. III/IV/VI-Pupils were equal and reacted normally to light. Extraocular movements were full and conjugate.    V/VII-reduced perception of tactile sensation over the right side of face compared to left; no facial weakness. VIII-normal. X-normal speech and symmetrical palatal movement. XI: trapezius strength/neck flexion strength normal bilaterally XII-midline tongue extension with normal strength. Motor: No drift of upper and lower extremities; mild right hip flexor weakness; motor exam otherwise unremarkable. Sensory: Reduced perception of tactile sensation over right extremities compared to left extremities. Deep Tendon Reflexes: 2+ and symmetric. Plantars: Flexor bilaterally Cerebellar: Normal finger-to-nose testing. Carotid auscultation: Normal  Results for orders placed or performed during the hospital encounter of 04/21/16 (from the past 48 hour(s))  CBG monitoring, ED     Status: Abnormal   Collection Time: 04/21/16  3:47 AM  Result Value Ref Range   Glucose-Capillary 128 (H) 65 - 99 mg/dL  CBC     Status: Abnormal   Collection Time: 04/21/16  3:50 AM  Result Value Ref Range   WBC 11.6 (H) 4.0 - 10.5 K/uL   RBC 4.29 4.22 - 5.81 MIL/uL   Hemoglobin 14.5 13.0 - 17.0 g/dL   HCT 84.5 36.4 - 68.0 %   MCV 95.3 78.0 - 100.0 fL   MCH 33.8 26.0 - 34.0 pg   MCHC 35.5 30.0 - 36.0 g/dL   RDW 32.1 22.4 - 82.5 %   Platelets 131 (L) 150 - 400 K/uL  Differential     Status: Abnormal   Collection Time: 04/21/16  3:50 AM  Result Value Ref Range   Neutrophils Relative % 79 %   Neutro Abs 9.3 (H) 1.7 - 7.7 K/uL   Lymphocytes Relative 13 %   Lymphs Abs 1.5 0.7 - 4.0 K/uL   Monocytes Relative 6 %   Monocytes Absolute 0.7 0.1 - 1.0 K/uL   Eosinophils Relative 2 %    Eosinophils Absolute 0.2 0.0 - 0.7 K/uL   Basophils Relative 0 %   Basophils Absolute 0.0 0.0 - 0.1 K/uL  I-stat troponin, ED     Status: None   Collection Time: 04/21/16  3:53 AM  Result Value Ref Range   Troponin i, poc 0.00 0.00 - 0.08 ng/mL   Comment 3            Comment: Due to the release kinetics of cTnI, a negative result within the first hours of the onset of symptoms does not rule out myocardial infarction with certainty. If myocardial infarction is still suspected, repeat the test at appropriate intervals.   I-Stat Chem 8, ED     Status: Abnormal   Collection Time: 04/21/16  3:55 AM  Result Value Ref Range   Sodium 139 135 - 145 mmol/L   Potassium 3.6 3.5 - 5.1 mmol/L   Chloride 105 101 - 111 mmol/L   BUN 18 6 - 20 mg/dL   Creatinine, Ser 0.03 0.61 - 1.24 mg/dL   Glucose, Bld 704 (H) 65 - 99 mg/dL   Calcium, Ion 8.88 9.16 - 1.30 mmol/L   TCO2 22 0 - 100 mmol/L   Hemoglobin 14.6 13.0 - 17.0 g/dL  HCT 43.0 39.0 - 52.0 %   Ct Head Code Stroke W/o Cm  Result Date: 04/21/2016 CLINICAL DATA:  60 year old male with code stroke. EXAM: CT HEAD WITHOUT CONTRAST TECHNIQUE: Contiguous axial images were obtained from the base of the skull through the vertex without intravenous contrast. COMPARISON:  Head CT dated 10/20/2009 and brain MRI dated 04/02/2016 FINDINGS: The ventricles and the sulci are appropriate in size for the patient's age. There is no intracranial hemorrhage. No midline shift or mass effect identified. The gray-white matter differentiation is preserved. The visualized paranasal sinuses and mastoid air cells are well aerated. The calvarium is intact. IMPRESSION: No acute intracranial pathology. These results were called by telephone at the time of interpretation on 04/21/2016 at 4:07 am to Dr. Roseanne Reno, who verbally acknowledged these results. Electronically Signed   By: Elgie Collard M.D.   On: 04/21/2016 04:09   Assessment: 60 y.o. male with multiple risk factors  for stroke presenting with probable small left subcortical small vessel cerebral infarction.  Stroke Risk Factors - diabetes mellitus and hyperlipidemia  Plan: 1. HgbA1c, fasting lipid panel 2. MRI, MRA  of the brain without contrast 3. PT consult, OT consult, Speech consult 4. Echocardiogram 5. Carotid dopplers 6. Prophylactic therapy-Antiplatelet med: Aspirin  7. Risk factor modification 8. Telemetry monitoring  C.R. Roseanne Reno, MD [facility] [phone]  04/21/2016, 4:17 AM

## 2016-04-21 NOTE — Progress Notes (Signed)
Admitted to 5C06 via ED stretcher.  Oriented to room & safety precautions, tele applied & verified, denies pain.  Stroke Education initiated, family updated on plan for the day. Diet ordered.

## 2016-04-21 NOTE — ED Triage Notes (Signed)
Pt arrives to ED as a code stroke, LSW 2200 04/20/16, woke up at 0255 and sat up to get out of bed and fell forward landing on the floor. Wife heard noise and found pt down. R sided weakness, slurred speech and R sided facial droop per EMS. Symptoms improving upon his arrival to ED. Pt was noted to have CBG of 35 with EMS, was able to tolerate orange juice, did not appear hypoglycemic per EMS. Repeat CBG was 152.

## 2016-04-21 NOTE — ED Notes (Addendum)
Pt was at home and fell out of bed woke wife up. Pt is diabetic. CBG at scene was 35 and corrected. Right sided weakness still present. Slurred speech resolved. Pt passed stroke swallow screen

## 2016-04-21 NOTE — Progress Notes (Signed)
EEG Completed; Results Pending  

## 2016-04-21 NOTE — Care Management Note (Signed)
Case Management Note  Patient Details  Name: Mike Stewart MRN: [id number] Date of Birth: 11/08/1955  Subjective/Objective:   Pt in to r/o CVA. MRI results negative. He is from home with his spouse.                  Action/Plan: PT recommending outpatient therapy. CM following for discharge needs.   Expected Discharge Date:                  Expected Discharge Plan:  Home/Self Care  In-House Referral:     Discharge planning Services     Post Acute Care Choice:    Choice offered to:     DME Arranged:    DME Agency:     HH Arranged:    HH Agency:     Status of Service:  In process, will continue to follow  If discussed at Long Length of Stay Meetings, dates discussed:    Additional Comments:  Kermit Balo, RN 04/21/2016, 2:59 PM

## 2016-04-21 NOTE — ED Provider Notes (Signed)
[facility]-EMERGENCY DEPT Provider Note   CSN: [id number] Arrival date & time: 04/21/16  0345  First Provider Contact:  First MD Initiated Contact with Patient 04/21/16 0345        History   Chief Complaint No chief complaint on file.   HPI Mike Stewart is a 60 y.o. male.  The history is provided by the patient and the EMS personnel.  He was transported to the ED as a code stroke. He had gotten out of bed and fallen and was found to have a blood sugar of 35. Speech was garbled and he was weak on the right side. He was last known normal at 10 PM. Wife told EMS that he normally is far more functional when blood sugar gets this low. There is no head trauma. He denies chest pain, heaviness, tightness, pressure. EMS reported that blood sugar improved to 125, but speech did not return to normal and they noticed a facial droop.  Past Medical History:  Diagnosis Date  . Diabetes mellitus    dx 14 years ago  . Hyperkalemia   . Hyperlipidemia   . Obstructive sleep apnea    not compliant with CPAP  . RBBB (right bundle branch block)   . Syncope    recurrent unexplained syncope    Patient Active Problem List   Diagnosis Date Noted  . Syncope 01/12/2011  . Diabetes mellitus 01/12/2011  . Sleep apnea, obstructive 01/12/2011    Past Surgical History:  Procedure Laterality Date  . CARDIAC CATHETERIZATION    . HERNIA REPAIR         Home Medications    Prior to Admission medications   Medication Sig Start Date End Date Taking? Authorizing Provider  aspirin 81 MG tablet Take 81 mg by mouth daily.      Historical Provider, MD  insulin aspart (NOVOLOG) 100 UNIT/ML injection As directed     Historical Provider, MD    Family History Family History  Problem Relation Age of Onset  . Adopted: Yes    Social History Social History  Substance Use Topics  . Smoking status: Current Some Day Smoker    Types: Cigars  . Smokeless tobacco: Not on file     Comment: smokes several cigars per  day  . Alcohol use Yes     Comment: several beers on the weekends     Allergies   Omnipaque [iohexol] and Phenobarbital   Review of Systems Review of Systems  All other systems reviewed and are negative.    Physical Exam Updated Vital Signs BP 136/77 (BP Location: Right Arm)   Pulse 78   Temp 97.9 F (36.6 C) (Oral)   Resp 16   Ht  (1.778 m)   Wt 184 lb 1.4 oz (83.5 kg)   SpO2 97%   BMI 26.41 kg/m   Physical Exam  Nursing note and vitals reviewed.  60 year old male, resting comfortably and in no acute distress. Vital signs are normal. Oxygen saturation is 97%, which is normal. Head is normocephalic and atraumatic. PERRLA, EOMI. Oropharynx is clear. Neck is nontender and supple without adenopathy or JVD. Back is nontender and there is no CVA tenderness. Lungs are clear without rales, wheezes, or rhonchi. Chest is nontender. Heart has regular rate and rhythm without murmur. Abdomen is soft, flat, nontender without masses or hepatosplenomegaly and peristalsis is normoactive. Extremities have no cyanosis or edema, full range of motion is present. Skin is warm and dry without rash. Neurologic: Mental status  is normal, speech is normal, cranial nerves are intact, there are no motor or sensory deficits. There is no pronator drift.  ED Treatments / Results  Labs (all labs ordered are listed, but only abnormal results are displayed) Labs Reviewed  CBC - Abnormal; Notable for the following:       Result Value   WBC 11.6 (*)    Platelets 131 (*)    All other components within normal limits  DIFFERENTIAL - Abnormal; Notable for the following:    Neutro Abs 9.3 (*)    All other components within normal limits  COMPREHENSIVE METABOLIC PANEL - Abnormal; Notable for the following:    Glucose, Bld 131 (*)    Calcium 8.8 (*)    Total Protein 5.6 (*)    All other components within normal limits  CBG MONITORING, ED - Abnormal; Notable for the following:     Glucose-Capillary 128 (*)    All other components within normal limits  I-STAT CHEM 8, ED - Abnormal; Notable for the following:    Glucose, Bld 128 (*)    All other components within normal limits  PROTIME-INR  APTT  I-STAT TROPOININ, ED    EKG  EKG Interpretation  Date/Time:  Monday April 21 2016 04:03:08 EDT Ventricular Rate:  81 PR Interval:    QRS Duration: 125 QT Interval:  399 QTC Calculation: 464 R Axis:   82 Text Interpretation:  Sinus rhythm Probable left atrial enlargement IVCD, consider atypical RBBB Minimal ST elevation, anterior leads When compared with ECG of 01/06/2011, No significant change was found Confirmed by [facility]  MD, Ervine Witucki ([id number]) on 04/21/2016 4:17:05 AM       Radiology Ct Head Code Stroke W/o Cm  Result Date: 04/21/2016 CLINICAL DATA:  60 year old male with code stroke. EXAM: CT HEAD WITHOUT CONTRAST TECHNIQUE: Contiguous axial images were obtained from the base of the skull through the vertex without intravenous contrast. COMPARISON:  Head CT dated 10/20/2009 and brain MRI dated 04/02/2016 FINDINGS: The ventricles and the sulci are appropriate in size for the patient's age. There is no intracranial hemorrhage. No midline shift or mass effect identified. The gray-white matter differentiation is preserved. The visualized paranasal sinuses and mastoid air cells are well aerated. The calvarium is intact. IMPRESSION: No acute intracranial pathology. These results were called by telephone at the time of interpretation on 04/21/2016 at 4:07 am to Dr. Roseanne Reno, who verbally acknowledged these results. Electronically Signed   By: Elgie Collard M.D.   On: 04/21/2016 04:09   Procedures Procedures (including critical care time)  Medications Ordered in ED Medications  acetaminophen (TYLENOL) tablet 650 mg (650 mg Oral Given 04/21/16 0421)     Initial Impression / Assessment and Plan / ED Course  I have reviewed the triage vital signs and the nursing  notes.  Pertinent labs & imaging results that were available during my care of the patient were reviewed by me and considered in my medical decision making (see chart for details).  Clinical Course    Episode of slurred speech and right-sided weakness which is most likely secondary to hypoglycemia. It can take a period of time for symptoms to resolve after correction of blood sugar. He is sent for CT and may need to consider MRI. However, at this point, I feel his problems are secondary to hypoglycemia. Old records are reviewed confirming management of diabetes but no similar past visits.  CT is unremarkable as is screening lab work and ECG. Cases been discussed with Dr.  Roseanne Reno of neurology service who feels that patient still has altered sensation and needs to be considereda stroke until proven otherwise. He will be admitted for workup of stroke/TIA. Case is discussed with Dr. Clyde Lundborg of [facility] who agrees to admit the patient under observation status.  Final Clinical Impressions(s) / ED Diagnoses   Final diagnoses:  Acute ischemic stroke (HCC)  Hypoglycemia  Thrombocytopenia (HCC)    New Prescriptions New Prescriptions   No medications on file     Dione Booze, MD 04/21/16 0501

## 2016-04-21 NOTE — Evaluation (Addendum)
Physical Therapy Evaluation Patient Details Name: Maribel Luis MRN: [id number] DOB: [birth year] Today's Date: 04/21/2016   History of Present Illness  Saeed Toren a 60 y.o.malewith medical history significant of hyperlipidemia, diabetes mellitus, OSA not using CPAP, syncope, right bundle blockage, tobacco abuse, s/p of heart monitor placement, who was admitted with slurred speech, right facial droop, right arm and leg numbness and weakness.  Clinical Impression  Pt admitted with the above and presents with the deficits listed below. Pt currently functioning at min guard for balance and safety. Pt eager to return home and to work, but reports he is functioning at 10% of baseline. Lives at home with wife who is available for assistance. PT currently recommends outpatient physical therapy to address deficits in strength, endurance, sensation, balance, and gait to reach mod I for safety. However, pt may progress and no longer require OP therapy. Pt would benefit from stair training next session. Continue acute follow.     Follow Up Recommendations Outpatient PT;Supervision - Intermittent;Other (comment) (Pt may progress and no longer require outpatient therapy.)    Equipment Recommendations  None recommended by PT    Recommendations for Other Services       Precautions / Restrictions Precautions Precautions: Fall Restrictions Weight Bearing Restrictions: No      Mobility  Bed Mobility Overal bed mobility: Modified Independent                Transfers Overall transfer level: Needs assistance Equipment used: None Transfers: Sit to/from Stand Sit to Stand: Supervision            Ambulation/Gait Ambulation/Gait assistance: Min guard Ambulation Distance (Feet): 300 Feet Assistive device: None Gait Pattern/deviations: Decreased weight shift to right;Step-through pattern;Decreased stride length   Gait velocity interpretation: Below normal speed for age/gender General  Gait Details: Pt reported dizziness with vertical head movement. Pt fatigued with ambulation and required rest break due to weakness.  Stairs            Wheelchair Mobility    Modified Rankin (Stroke Patients Only) Modified Rankin (Stroke Patients Only) Pre-Morbid Rankin Score: No symptoms Modified Rankin: Slight disability     Balance Overall balance assessment: Needs assistance Sitting-balance support: Feet supported;No upper extremity supported Sitting balance-Leahy Scale: Good     Standing balance support: No upper extremity supported Standing balance-Leahy Scale: Good                   Standardized Balance Assessment Standardized Balance Assessment : Dynamic Gait Index   Dynamic Gait Index Level Surface: Mild Impairment Change in Gait Speed: Normal Gait with Horizontal Head Turns: Normal Gait with Vertical Head Turns: Mild Impairment Gait and Pivot Turn: Normal Step Over Obstacle: Mild Impairment Step Around Obstacles: Normal Steps: Mild Impairment Total Score: 20       Pertinent Vitals/Pain Pain Assessment: No/denies pain    Home Living Family/patient expects to be discharged to:: Private residence Living Arrangements: Spouse/significant other Available Help at Discharge: Family Type of Home: House Home Access: Stairs to enter Entrance Stairs-Rails: Left Entrance Stairs-Number of Steps: 3 Home Layout: One level Home Equipment: None      Prior Function Level of Independence: Independent         Comments: 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     Hand Dominance        Extremity/Trunk Assessment   Upper Extremity Assessment: RUE deficits/detail RUE Deficits / Details: Decreased grip strength, 4/5  R shoulder flexion, R finger numbness and tingling   RUE Sensation: decreased light touch;history of peripheral neuropathy     Lower Extremity Assessment: RLE  deficits/detail RLE Deficits / Details: 4/5 hip flexion, 4+/5 knee extension    Cervical / Trunk Assessment:  (Pt with decreased R facial and cervical sensation at 20%)  Communication   Communication: No difficulties  Cognition Arousal/Alertness: Awake/alert Behavior During Therapy: WFL for tasks assessed/performed Overall Cognitive Status: Within Functional Limits for tasks assessed                      General Comments General comments (skin integrity, edema, etc.): Modified DGI: score for steps and step around obstacles estimated by pt's performance during ambulation. Pt reports he is at 10% of his previous level of function, and is limited by fatigue and R sided weakness.    Exercises        Assessment/Plan    PT Assessment Patient needs continued PT services  PT Diagnosis Difficulty walking;Abnormality of gait;Generalized weakness   PT Problem List Decreased strength;Decreased activity tolerance;Decreased balance;Decreased mobility;Decreased coordination;Impaired sensation;Decreased range of motion  PT Treatment Interventions Gait training;Stair training;Functional mobility training;Therapeutic activities;Therapeutic exercise;Balance training;Neuromuscular re-education;Patient/family education   PT Goals (Current goals can be found in the Care Plan section) Acute Rehab PT Goals Patient Stated Goal: To go back to work today. PT Goal Formulation: With patient Time For Goal Achievement: 04/28/16 Potential to Achieve Goals: Good Additional Goals Additional Goal #1: Pt will achieve >41 on Berg Balance Test to demonstrate decreased fall risk.     Frequency Min 4X/week   Barriers to discharge        Co-evaluation               End of Session Equipment Utilized During Treatment: Gait belt Activity Tolerance: Patient limited by fatigue Patient left: in bed;with family/visitor present;with call bell/phone within reach      Functional Assessment Tool Used:  clinical judgement Functional Limitation: Mobility: Walking and moving around Mobility: Walking and Moving Around Current Status (A7681): At least 1 percent but less than 20 percent impaired, limited or restricted Mobility: Walking and Moving Around Goal Status [phone]): At least 1 percent but less than 20 percent impaired, limited or restricted    Time: 0849-0912 PT Time Calculation (min) (ACUTE ONLY): 23 min   Charges:   PT Evaluation $PT Eval Moderate Complexity: 1 Procedure PT Treatments $Gait Training: 8-22 mins   PT G Codes:   PT G-Codes **NOT FOR INPATIENT CLASS** Functional Assessment Tool Used: clinical judgement Functional Limitation: Mobility: Walking and moving around Mobility: Walking and Moving Around Current Status (O0355): At least 1 percent but less than 20 percent impaired, limited or restricted Mobility: Walking and Moving Around Goal Status [phone]): At least 1 percent but less than 20 percent impaired, limited or restricted    [facility] 04/21/2016, 11:09 AM  Park Liter, SPT (student physical therapist) Acute Rehabilitation Services [phone]

## 2016-04-21 NOTE — Progress Notes (Signed)
Patient seen and examined. Admitted secondary to stroke like symptoms. Neg CT head on admission. Hemodynamically stable. Please refer to H&P written by Dr. Clyde Lundborg for further info/details on admission.  Plan: -will follow work up r/o pathway  -neurology consulted -will follow rec's from PT/OT and SPL  Mike Stewart [phone]

## 2016-04-22 ENCOUNTER — Observation Stay (HOSPITAL_BASED_OUTPATIENT_CLINIC_OR_DEPARTMENT_OTHER): Payer: 59

## 2016-04-22 DIAGNOSIS — I6789 Other cerebrovascular disease: Secondary | ICD-10-CM

## 2016-04-22 DIAGNOSIS — R569 Unspecified convulsions: Secondary | ICD-10-CM | POA: Diagnosis not present

## 2016-04-22 DIAGNOSIS — E108 Type 1 diabetes mellitus with unspecified complications: Secondary | ICD-10-CM | POA: Diagnosis not present

## 2016-04-22 DIAGNOSIS — E162 Hypoglycemia, unspecified: Secondary | ICD-10-CM | POA: Diagnosis not present

## 2016-04-22 DIAGNOSIS — G43109 Migraine with aura, not intractable, without status migrainosus: Secondary | ICD-10-CM | POA: Diagnosis not present

## 2016-04-22 DIAGNOSIS — R2990 Unspecified symptoms and signs involving the nervous system: Secondary | ICD-10-CM | POA: Diagnosis not present

## 2016-04-22 DIAGNOSIS — E785 Hyperlipidemia, unspecified: Secondary | ICD-10-CM | POA: Diagnosis not present

## 2016-04-22 DIAGNOSIS — G4733 Obstructive sleep apnea (adult) (pediatric): Secondary | ICD-10-CM

## 2016-04-22 DIAGNOSIS — E1065 Type 1 diabetes mellitus with hyperglycemia: Secondary | ICD-10-CM

## 2016-04-22 DIAGNOSIS — E119 Type 2 diabetes mellitus without complications: Secondary | ICD-10-CM | POA: Diagnosis not present

## 2016-04-22 LAB — ECHOCARDIOGRAM COMPLETE
Height: 70 in
WEIGHTICAEL: 2945.35 [oz_av]

## 2016-04-22 LAB — VAS US CAROTID
LCCADSYS: -95 cm/s
LCCAPSYS: 154 cm/s
LEFT ECA DIAS: -15 cm/s
LEFT VERTEBRAL DIAS: 28 cm/s
Left CCA dist dias: -26 cm/s
Left CCA prox dias: 36 cm/s
Left ICA dist dias: -43 cm/s
Left ICA dist sys: -105 cm/s
Left ICA prox dias: -32 cm/s
Left ICA prox sys: -76 cm/s
RCCADSYS: -89 cm/s
RCCAPDIAS: 25 cm/s
RCCAPSYS: 110 cm/s
RIGHT ECA DIAS: -16 cm/s
RIGHT VERTEBRAL DIAS: -19 cm/s

## 2016-04-22 LAB — LIPID PANEL
Cholesterol: 139 mg/dL (ref 0–200)
HDL: 39 mg/dL — ABNORMAL LOW
LDL Cholesterol: 77 mg/dL (ref 0–99)
Total CHOL/HDL Ratio: 3.6 ratio
Triglycerides: 113 mg/dL
VLDL: 23 mg/dL (ref 0–40)

## 2016-04-22 LAB — GLUCOSE, CAPILLARY
GLUCOSE-CAPILLARY: 352 mg/dL — AB (ref 65–99)
GLUCOSE-CAPILLARY: 385 mg/dL — AB (ref 65–99)
GLUCOSE-CAPILLARY: 317 mg/dL — AB (ref 65–99)
GLUCOSE-CAPILLARY: 229 mg/dL — AB (ref 65–99)
Glucose-Capillary: 261 mg/dL — ABNORMAL HIGH (ref 65–99)

## 2016-04-22 LAB — TSH: TSH: 3.195 u[IU]/mL (ref 0.350–4.500)

## 2016-04-22 MED ORDER — VALPROATE SODIUM 500 MG/5ML IV SOLN
500.0000 mg | Freq: Once | INTRAVENOUS | Status: AC
  Administered 2016-04-22: 500 mg via INTRAVENOUS
  Filled 2016-04-22: qty 5

## 2016-04-22 MED ORDER — VALPROIC ACID 250 MG PO CAPS
500.0000 mg | ORAL_CAPSULE | Freq: Two times a day (BID) | ORAL | Status: DC
  Administered 2016-04-22 – 2016-04-23 (×2): 500 mg via ORAL
  Filled 2016-04-22 (×2): qty 2

## 2016-04-22 MED ORDER — TOPIRAMATE 25 MG PO TABS
50.0000 mg | ORAL_TABLET | Freq: Two times a day (BID) | ORAL | Status: DC
  Administered 2016-04-22 – 2016-04-23 (×2): 50 mg via ORAL
  Filled 2016-04-22 (×2): qty 2

## 2016-04-22 MED ORDER — VALPROIC ACID 250 MG PO CAPS: 250.0000 mg | ORAL_CAPSULE | Freq: Two times a day (BID) | ORAL | Status: DC

## 2016-04-22 MED ORDER — INSULIN GLARGINE 100 UNIT/ML ~~LOC~~ SOLN
20.0000 [IU] | Freq: Every day | SUBCUTANEOUS | Status: DC
  Administered 2016-04-22: 20 [IU] via SUBCUTANEOUS
  Filled 2016-04-22 (×2): qty 0.2

## 2016-04-22 MED ORDER — INSULIN ASPART 100 UNIT/ML ~~LOC~~ SOLN
5.0000 [IU] | Freq: Three times a day (TID) | SUBCUTANEOUS | Status: DC
  Administered 2016-04-22 – 2016-04-23 (×3): 5 [IU] via SUBCUTANEOUS

## 2016-04-22 MED ORDER — LEVOTHYROXINE SODIUM 88 MCG PO TABS
88.0000 ug | ORAL_TABLET | Freq: Every day | ORAL | Status: DC
  Administered 2016-04-23: 88 ug via ORAL
  Filled 2016-04-22: qty 1

## 2016-04-22 NOTE — Progress Notes (Signed)
  Echocardiogram 2D Echocardiogram has been performed.  [name] 04/22/2016, 11:34 AM

## 2016-04-22 NOTE — Progress Notes (Signed)
Inpatient Diabetes Program Recommendations  AACE/ADA: New Consensus Statement on Inpatient Glycemic Control (2015)  Target Ranges:  Prepandial:   less than 140 mg/dL      Peak postprandial:   less than 180 mg/dL (1-2 hours)      Critically ill patients:  140 - 180 mg/dL   Lab Results  Component Value Date   GLUCAP 317 (H) 04/22/2016   HGBA1C (H) 12/29/2010    11.5 (NOTE)                                                                       According to the ADA Clinical Practice Recommendations for 2011, when HbA1c is used as a screening test:   >=6.5%   Diagnostic of Diabetes Mellitus           (if abnormal result  is confirmed)  5.7-6.4%   Increased risk of developing Diabetes Mellitus  References:Diagnosis and Classification of Diabetes Mellitus,Diabetes Care,2011,34(Suppl 1):S62-S69 and Standards of Medical Care in         Diabetes - 2011,Diabetes Care,2011,34  (Suppl 1):S11-S61.    Review of Glycemic Control  Results for Mike Stewart, Mike Stewart (MRN [id number]) as of 04/22/2016 13:30  Ref. Range 04/21/2016 12:16 04/21/2016 16:28 04/21/2016 21:07 04/22/2016 06:16 04/22/2016 11:26  Glucose-Capillary Latest Ref Range: 65 - 99 mg/dL 747 (H) 340 (H) 370 (H) 385 (H) 317 (H)    Diabetes history: Type 2 Outpatient Diabetes medications: Lantus 30 units qhs, Novolog 1-15 units tid Current orders for Inpatient glycemic control: Novolog 0-5 units qhs, Novolog 0-9 units tid  Inpatient Diabetes Program Recommendations: Please consider adding Lantus 20 units qhs, and Novolog 5 units tid with meals (hold if he eats less than 50%).  Per ADA recommendations "consider performing an A1C on all patients with diabetes or hypreglycemia admitted to the hospital if not performed in the prior 3 months".   Susette Racer, RN, BA, MHA, CDE Diabetes Coordinator Inpatient Diabetes Program  [phone] (Team Pager) [phone] [facility] Office) 04/22/2016 1:35 PM

## 2016-04-22 NOTE — Progress Notes (Addendum)
STROKE TEAM PROGRESS NOTE   SUBJECTIVE (INTERVAL HISTORY) Lying in bed. Son at bedside. He stated that after working with PT/OT, he developed headache and photophobia. He has HA 4-5 days per week and use a lot of ibuprofen. Triggers usually due to over exertion, fatigue, out in heat, etc.    OBJECTIVE Temp:  [97.5 F (36.4 C)-98.2 F (36.8 C)] 98.1 F (36.7 C) (08/01 1001) Pulse Rate:  [59-77] 75 (08/01 1001) Cardiac Rhythm: Normal sinus rhythm (08/01 0700) Resp:  [16-18] 18 (08/01 1001) BP: (91-125)/(55-72) 125/72 (08/01 1001) SpO2:  [96 %-99 %] 98 % (08/01 1001)  CBC:   Recent Labs Lab 04/21/16 0350 04/21/16 0355  WBC 11.6*  --   NEUTROABS 9.3*  --   HGB 14.5 14.6  HCT 40.9 43.0  MCV 95.3  --   PLT 131*  --     Basic Metabolic Panel:   Recent Labs Lab 04/21/16 0350 04/21/16 0355  NA 138 139  K 3.6 3.6  CL 108 105  CO2 23  --   GLUCOSE 131* 128*  BUN 16 18  CREATININE 0.73 0.80  CALCIUM 8.8*  --     Lipid Panel:     Component Value Date/Time   CHOL 139 04/22/2016 0613   TRIG 113 04/22/2016 0613   HDL 39 (L) 04/22/2016 0613   CHOLHDL 3.6 04/22/2016 0613   VLDL 23 04/22/2016 0613   LDLCALC 77 04/22/2016 0613   HgbA1c:  Lab Results  Component Value Date   HGBA1C (H) 12/29/2010    11.5 (NOTE)                                                                       According to the ADA Clinical Practice Recommendations for 2011, when HbA1c is used as a screening test:   >=6.5%   Diagnostic of Diabetes Mellitus           (if abnormal result  is confirmed)  5.7-6.4%   Increased risk of developing Diabetes Mellitus  References:Diagnosis and Classification of Diabetes Mellitus,Diabetes Care,2011,34(Suppl 1):S62-S69 and Standards of Medical Care in         Diabetes - 2011,Diabetes Care,2011,34  (Suppl 1):S11-S61.   Urine Drug Screen:     Component Value Date/Time   LABOPIA NONE DETECTED 04/21/2016 1330   COCAINSCRNUR NONE DETECTED 04/21/2016 1330   LABBENZ  NONE DETECTED 04/21/2016 1330   AMPHETMU NONE DETECTED 04/21/2016 1330   THCU NONE DETECTED 04/21/2016 1330   LABBARB NONE DETECTED 04/21/2016 1330      IMAGING Dr. Roda Shutters has personally reviewed the radiological images below and agree with the radiology interpretations.  Ct Head Code Stroke W/o Cm 04/21/2016 No acute intracranial pathology.   MRI HEAD  04/21/2016 No acute infarct or intracranial hemorrhage. Mild chronic microvascular changes unchanged from prior exam. Persistent opacification left middle ear and left mastoid air cells without obstructing lesion of the eustachian tube noted. Minimal mucosal thickening paranasal sinuses.   MRA HEAD  04/21/2016 Mild narrowing proximal cavernous segment left internal carotid artery. Mild narrowing supraclinoid segment right internal carotid artery. Slightly hypoplastic A1 segment right anterior cerebral artery. Middle cerebral artery mild branch vessel irregularity and mild narrowing bilaterally. Mild narrowing distal right vertebral artery. Nonvisualized right  posterior inferior cerebellar artery. Mild to moderate narrowing proximal left posterior inferior cerebellar artery. Poor delineation of majority of the anterior inferior cerebellar arteries. No significant narrowing of the basilar artery.   EEG  The EKG channel demonstrates are regular rhythm with a rate of 70-75 beats per minute.  The background consists of robust well-formed alpha activity. The best dominant posterior rhythm is 11 Hz. This is symmetric and reacts as expected with eye opening.  There are no focal asymmetries. No epileptiform discharges are present. No seizures are recorded.  Drowsiness is recorded and is normal in appearance. Early stages of sleep are recorded and demonstrate normal architecture.   CUS Bilateral ICA's demonstrated mild intimal thickening with homogenous plaque formations suggestive of a 1-39% category. Vertebral arteries appear patent with antegrade flow.    TTE  - Left ventricle: The cavity size was normal. Wall thickness was increased in a pattern of mild LVH. Systolic function was vigorous. The estimated ejection fraction was in the range of 65% to 70%. Wall motion was normal; there were no regional wall motion abnormalities. - Left atrium: The atrium was normal in size. - Tricuspid valve: There was trivial regurgitation. - Pulmonary arteries: PA peak pressure: 25 mm Hg (S). - Inferior vena cava: The vessel was normal in size. The respirophasic diameter changes were in the normal range (>= 50%), consistent with normal central venous pressure. Impressions:   LVEF 65-70%, mild LVH, normal wall motion, normal diastolic function, normal LA size.   PHYSICAL EXAM Temp:  [97.5 F (36.4 C)-98.2 F (36.8 C)] 98.1 F (36.7 C) (08/01 1001) Pulse Rate:  [59-77] 75 (08/01 1001) Resp:  [16-18] 18 (08/01 1001) BP: (91-125)/(55-72) 125/72 (08/01 1001) SpO2:  [96 %-99 %] 98 % (08/01 1001)  General - Well nourished, well developed, in no apparent distress.  Ophthalmologic - Sharp disc margins OU.  Cardiovascular - Regular rate and rhythm.  Mental Status -  Level of arousal and orientation to time, place, and person were intact. Language including expression, naming, repetition, comprehension was assessed and found intact. Fund of Knowledge was assessed and was intact.  Cranial Nerves II - XII - II - Visual field intact OU. III, IV, VI - Extraocular movements intact. V - Facial sensation intact bilaterally. VII - Facial movement intact bilaterally. VIII - Hearing & vestibular intact bilaterally. X - Palate elevates symmetrically. XI - Chin turning & shoulder shrug intact bilaterally. XII - Tongue protrusion intact.  Motor Strength - The patient's strength was normal in all extremities and pronator drift was absent.  Bulk was normal and fasciculations were absent.   Motor Tone - Muscle tone was assessed at the neck and appendages and was  normal.  Reflexes - The patient's reflexes were 1+ in all extremities and he had no pathological reflexes.  Sensory - Light touch, temperature/pinprick were assessed and were symmetrical.    Coordination - The patient had normal movements in the hands and feet with no ataxia or dysmetria.  Tremor was absent.  Gait and Station - The patient's transfers, posture, gait, station, and turns were observed as normal.   ASSESSMENT/PLAN Mike Stewart is a 60 y.o. male with history of DM, HLD, migraine and OSA presenting with altered mental status, slurred speech and R sided weakness in the setting of hypoglycemia. He did not receive IV t-PA due to beyond time treatment for consideration, minimal residual deficits..   Focal neurological deficit associated with hypoglycemia  Resultant - fall, right sided weakness, dysarthria -  now resolved  MRI  No acute stroke.  MRA  unremarkable  Carotid Doppler  No significant stenosis   2D Echo  EF 65-70%. No source of embolus   LDL 77  HgbA1c pending   Lovenox 40 mg sq daily for VTE prophylaxis Diet Carb Modified Fluid consistency: Thin; Room service appropriate? Yes  No antithrombotic prior to admission, now on aspirin 325 mg daily. Continue ASA on discharge.  Patient counseled to be compliant with his antithrombotic medications  Ongoing aggressive stroke risk factor management  Therapy recommendations:  OP PT, no OT  Disposition:  Return home  ? Seizure vs. Migraine aura  Hx of GTC with profound hypoglycemia episodes  Strange behavior without awareness followed by confusion, extreme HA and lethargy  Staring spells 3-4 times per week  EEG normal awake, drowsy, and sleep EEG.   On imitrex PRN and tramadol PRN  Will add depakote  bid for both migraine and seizure prevention   Hyperlipidemia  Home meds:  lipitor 40  LDL 77, goal < 70  lipitor  resumed   Diabetes with Hypoglycemia   HgbA1c pending, goal < 7.0  On  SSI  CBG monitoring  Diabetes RN recommends Lantus 20 qhs and novolog 5u tid with meals  Tobacco abuse  Current cigar smoker  Smoking cessation counseling provided  Nicotine patch provided  Pt is willing to quit  Other Stroke Risk Factors  ETOH use, advised to drink no more than 2 drink(s) a day  Obstructive sleep apnea, not compliant with CPAP at home  Other Active Problems  Leukocytosis without signs of infection  Hospital day # 0  Neurology will sign off. Please call with questions. Pt will follow up with carolyn martin NP at [facility] in about 2 months. Thanks for the consult.  Marvel Plan, MD PhD Stroke Neurology 04/22/2016 2:14 PM     To contact Stroke Continuity provider, please refer to [email or web address]. After hours, contact General Neurology

## 2016-04-22 NOTE — Procedures (Signed)
Electroencephalogram (EEG) Report  Date of study: 04/21/16  Requesting clinician: Marvel Plan, MD  Reason for study: evaluate for seizure  Brief clinical history: 59-yo man admitted for altered mental status, slurred speech, and right-sided weakness. He has a history of GTC seizures associated with hypoglycemia.   Medications:  Current Facility-Administered Medications:  .  0.9 %  sodium chloride infusion, , Intravenous, Continuous, Lorretta Harp, MD .  acetaminophen (TYLENOL) tablet 650 mg, 650 mg, Oral, Q6H PRN, Vassie Loll, MD, 650 mg at 04/21/16 1220 .  aspirin suppository 300 mg, 300 mg, Rectal, Daily **OR** aspirin tablet 325 mg, 325 mg, Oral, Daily, Lorretta Harp, MD, 325 mg at 04/21/16 0931 .  atorvastatin (LIPITOR) tablet 40 mg, 40 mg, Oral, QHS, Marvel Plan, MD .  dextrose 50 % solution 50 mL, 50 mL, Intravenous, PRN, Lorretta Harp, MD .  diphenhydrAMINE (BENADRYL) capsule 25 mg, 25 mg, Oral, Q4H PRN **OR** diphenhydrAMINE (BENADRYL) injection 25 mg, 25 mg, Intravenous, Q4H PRN, Roma Kayser Schorr, NP, 25 mg at 04/21/16 2158 .  enoxaparin (LOVENOX) injection 40 mg, 40 mg, Subcutaneous, Q24H, Lorretta Harp, MD, 40 mg at 04/21/16 0931 .  insulin aspart (novoLOG) injection 0-5 Units, 0-5 Units, Subcutaneous, QHS, Lorretta Harp, MD, 5 Units at 04/21/16 2156 .  insulin aspart (novoLOG) injection 0-9 Units, 0-9 Units, Subcutaneous, TID WC, Lorretta Harp, MD, 9 Units at 04/22/16 [phone] .  nicotine (NICODERM CQ - dosed in mg/24 hours) patch 21 mg, 21 mg, Transdermal, Daily, Lorretta Harp, MD, 21 mg at 04/21/16 0931 .  senna-docusate (Senokot-S) tablet 1 tablet, 1 tablet, Oral, QHS PRN, Lorretta Harp, MD .  SUMAtriptan (IMITREX) tablet 50 mg, 50 mg, Oral, Q8H PRN, Vassie Loll, MD, 50 mg at 04/22/16 0815 .  traMADol (ULTRAM) tablet 50 mg, 50 mg, Oral, Q6H PRN, Vassie Loll, MD, 50 mg at 04/21/16 1645  Description: This is a routine EEG performed using standard international 10-20 electrode placement. A total of 18  channels are recorded, including one for the EKG.  Activating Maneuvers: None  Findings:  The EKG channel demonstrates are regular rhythm with a rate of 70-75 beats per minute.   The background consists of robust well-formed alpha activity. The best dominant posterior rhythm is 11 Hz. This is symmetric and reacts as expected with eye opening.   There are no focal asymmetries. No epileptiform discharges are present. No seizures are recorded.   Drowsiness is recorded and is normal in appearance. Early stages of sleep are recorded and demonstrate normal architecture.    Impression: This is a normal awake, drowsy, and sleep EEG.    Rhona Leavens, MD [facility]

## 2016-04-22 NOTE — Progress Notes (Signed)
[facility] PROGRESS NOTE  Mike Stewart VJD:[id number] DOB: [birth year] DOA: 04/21/2016 PCP: Maryelizabeth Rowan, MD  Interim summary and HPI Mike Stewart is a 60 y.o. male with history of DM, HLD, migraine and OSA presenting with altered mental status, slurred speech and R sided weakness in the setting of hypoglycemia. He did not receive IV t-PA due to beyond time treatment for consideration, minimal residual deficits..  Work up negative for stroke and considering symptoms secondary to complex migraine vs TIA.  Assessment/Plan: 1-stroke like symptoms: appears to be secondary to complex migraine Vs TIA. -no stroke appreciated on current work up -for potential TIA, will discharge on aspirin for secondary prevention -advise to quit smoking and will continue risk factor modifications (control of BP, diabetes and cholesterol) -for migraine will use depakote and topamax. (see below)  2-complex migraines: -will use topamax and depakote for preventive measure treatment -imitrex, tylenol and PRN tramadol (while inpatient) for abortive therapy -cold compresses and low light  -will need outpatient follow up with headache clinic  3-Hypothyroidism: -will continue current dose of synthroid -will check TSH  4-type 2 diabetes: on insulin and with hypoglycemia on admission  -will follow A1C -continue SSI and lantus -advise to follow modified carb diet  5-HLD: -continue statins  6-tobacco abuse: -cessation counseling provided -continue nicotine patch   7-OSA: -not compliant with CPAP at home  -education and importance about compliance provided -will ordered while in the hospital   Code Status:Full Family Communication: wife at bedside  Disposition Plan: patient just completed work up for TIA/stroke. Continue having severe HA's; will initiate depakote and topamax as recommended by neurology. Hopefully home in am.   Consultants:  Neurology  Diabetes coordinator   Procedures: MRI  HEAD  04/21/2016 No acute infarct or intracranial hemorrhage. Mild chronic microvascular changes unchanged from prior exam. Persistent opacification left middle ear and left mastoid air cells without obstructing lesion of the eustachian tube noted. Minimal mucosal thickening paranasal sinuses.   MRA HEAD  04/21/2016 Mild narrowing proximal cavernous segment left internal carotid artery. Mild narrowing supraclinoid segment right internal carotid artery. Slightly hypoplastic A1 segment right anterior cerebral artery. Middle cerebral artery mild branch vessel irregularity and mild narrowing bilaterally. Mild narrowing distal right vertebral artery. Nonvisualized right posterior inferior cerebellar artery. Mild to moderate narrowing proximal left posterior inferior cerebellar artery. Poor delineation of majority of the anterior inferior cerebellar arteries. No significant narrowing of the basilar artery.   EEG  The EKG channel demonstrates are regular rhythm with a rate of 70-75beats per minute.  The background consists of robust well-formed alpha activity. The best dominant posterior rhythm is 11 Hz. This is symmetric and reacts as expected with eye opening.  There are no focal asymmetries. No epileptiform discharges are present. No seizures are recorded.  Drowsiness is recorded and is normal in appearance. Early stages of sleep are recorded and demonstrate normal architecture.   CUS Bilateral ICA's demonstrated mild intimal thickening with homogenous plaque formations suggestive of a 1-39% category. Vertebral arteries appear patent with antegrade flow.   TTE  - Left ventricle: The cavity size was normal. Wall thickness wasincreased in a pattern of mild LVH. Systolic function wasvigorous. The estimated ejection fraction was in the range of 65%to 70%. Wall motion was normal; there were no regional wallmotion abnormalities. - Left atrium: The atrium was normal in size. - Tricuspid valve: There  was trivial regurgitation. - Pulmonary arteries: PA peak pressure: 25 mm Hg (S). - Inferior vena cava: The vessel was normal  in size. Therespirophasic diameter changes were in the normal range (>= 50%),consistent with normal central venous pressure. Impressions:   LVEF 65-70%, mild LVH, normal wall motion, normal diastolicfunction, normal LA size.  EEG -normal awake, drowsy, and sleep EEG.    Antibiotics:  None   HPI/Subjective: Afebrile, no CP, no SOB. No dysarthria and reports essentially complete resolution of right sided weakness. Patient with ongoing bad HA and reporting some intermittent blurred vision.   Objective: Vitals:   04/22/16 1251 04/22/16 1342  BP: 110/65 107/78  Pulse: 67 67  Resp: (!) 18 (!) 18  Temp: 98.1 F (36.7 C) 97.7 F (36.5 C)    Intake/Output Summary (Last 24 hours) at 04/22/16 1739 Last data filed at 04/22/16 1300  Gross per 24 hour  Intake             1080 ml  Output                0 ml  Net             1080 ml   Filed Weights   04/21/16 0401  Weight: 83.5 kg (184 lb 1.4 oz)    Exam:   General:  Afebrile, no dysarthria; with complaints of severe HA and some blurred vision. No CP, no SOB.  Cardiovascular: S1 and S2, no rubs, no gallops, no rubs  Respiratory: CTA bilaterally  Abdomen: soft, NT, ND, positive BS  Musculoskeletal: no edema or cyanosis   Data Reviewed: Basic Metabolic Panel:  Recent Labs Lab 04/21/16 0350 04/21/16 0355  NA 138 139  K 3.6 3.6  CL 108 105  CO2 23  --   GLUCOSE 131* 128*  BUN 16 18  CREATININE 0.73 0.80  CALCIUM 8.8*  --    Liver Function Tests:  Recent Labs Lab 04/21/16 0350  AST 28  ALT 24  ALKPHOS 51  BILITOT 0.8  PROT 5.6*  ALBUMIN 3.6   CBC:  Recent Labs Lab 04/21/16 0350 04/21/16 0355  WBC 11.6*  --   NEUTROABS 9.3*  --   HGB 14.5 14.6  HCT 40.9 43.0  MCV 95.3  --   PLT 131*  --    CBG:  Recent Labs Lab 04/21/16 2107 04/22/16 0616 04/22/16 1126  04/22/16 1445 04/22/16 1622  GLUCAP 352* 385* 317* 261* 229*   Studies: Dg Chest 2 View  Result Date: 04/21/2016 CLINICAL DATA:  Shortness of Breath EXAM: CHEST  2 VIEW COMPARISON:  01/06/2011 FINDINGS: The heart size and mediastinal contours are within normal limits. Both lungs are clear. The visualized skeletal structures are unremarkable. Loop recorder is now seen anteriorly. IMPRESSION: No active cardiopulmonary disease. Electronically Signed   By: Alcide Clever M.D.   On: 04/21/2016 10:08  Mr Brain Wo Contrast  Result Date: 04/21/2016 CLINICAL DATA:  60 year old hypertensive diabetic male with hyperlipidemia and tobacco abuse presenting with slurred speech, right facial droop, right arm and leg numbness and weakness. Subsequent encounter. EXAM: MRI HEAD WITHOUT CONTRAST MRA HEAD WITHOUT CONTRAST TECHNIQUE: Multiplanar, multiecho pulse sequences of the brain and surrounding structures were obtained without intravenous contrast. Angiographic images of the head were obtained using MRA technique without contrast. COMPARISON:  04/21/2016 head CT.  04/02/2016 brain MR. FINDINGS: MRI HEAD FINDINGS No acute infarct or intracranial hemorrhage. Scattered punctate nonspecific white matter changes probably related to result of chronic microvascular changes unchanged from prior exam. No intracranial mass lesion noted on this unenhanced exam. No hydrocephalus. Persistent opacification left middle ear and left mastoid  air cells without obstructing lesion of the eustachian tube noted. Minimal mucosal thickening paranasal sinuses. No acute orbital abnormality noted. Cervical medullary junction within normal limits. Mild cervical spondylotic changes upper cervical spine. Major intracranial vascular structures are patent. MRA HEAD FINDINGS Mild narrowing proximal cavernous segment left internal carotid artery. Mild narrowing supraclinoid segment right internal carotid artery. Slightly hypoplastic A1 segment right  anterior cerebral artery. Middle cerebral artery mild branch vessel irregularity and mild narrowing bilaterally. Ectatic vertebral arteries. Left vertebral artery slightly dominant in size. Mild narrowing distal right vertebral artery. Nonvisualized right posterior inferior cerebellar artery. Mild to moderate narrowing proximal left posterior inferior cerebellar artery. Poor delineation of majority of the anterior inferior cerebellar arteries. No significant narrowing of the basilar artery. No aneurysm noted. IMPRESSION: MRI HEAD No acute infarct or intracranial hemorrhage. Mild chronic microvascular changes unchanged from prior exam. Persistent opacification left middle ear and left mastoid air cells without obstructing lesion of the eustachian tube noted. Minimal mucosal thickening paranasal sinuses. MRA HEAD Mild narrowing proximal cavernous segment left internal carotid artery. Mild narrowing supraclinoid segment right internal carotid artery. Slightly hypoplastic A1 segment right anterior cerebral artery. Middle cerebral artery mild branch vessel irregularity and mild narrowing bilaterally. Mild narrowing distal right vertebral artery. Nonvisualized right posterior inferior cerebellar artery. Mild to moderate narrowing proximal left posterior inferior cerebellar artery. Poor delineation of majority of the anterior inferior cerebellar arteries. No significant narrowing of the basilar artery. Electronically Signed   By: Lacy Duverney M.D.   On: 04/21/2016 12:44  Mr Maxine Glenn Head/brain UJ Cm  Result Date: 04/21/2016 CLINICAL DATA:  60 year old hypertensive diabetic male with hyperlipidemia and tobacco abuse presenting with slurred speech, right facial droop, right arm and leg numbness and weakness. Subsequent encounter. EXAM: MRI HEAD WITHOUT CONTRAST MRA HEAD WITHOUT CONTRAST TECHNIQUE: Multiplanar, multiecho pulse sequences of the brain and surrounding structures were obtained without intravenous contrast.  Angiographic images of the head were obtained using MRA technique without contrast. COMPARISON:  04/21/2016 head CT.  04/02/2016 brain MR. FINDINGS: MRI HEAD FINDINGS No acute infarct or intracranial hemorrhage. Scattered punctate nonspecific white matter changes probably related to result of chronic microvascular changes unchanged from prior exam. No intracranial mass lesion noted on this unenhanced exam. No hydrocephalus. Persistent opacification left middle ear and left mastoid air cells without obstructing lesion of the eustachian tube noted. Minimal mucosal thickening paranasal sinuses. No acute orbital abnormality noted. Cervical medullary junction within normal limits. Mild cervical spondylotic changes upper cervical spine. Major intracranial vascular structures are patent. MRA HEAD FINDINGS Mild narrowing proximal cavernous segment left internal carotid artery. Mild narrowing supraclinoid segment right internal carotid artery. Slightly hypoplastic A1 segment right anterior cerebral artery. Middle cerebral artery mild branch vessel irregularity and mild narrowing bilaterally. Ectatic vertebral arteries. Left vertebral artery slightly dominant in size. Mild narrowing distal right vertebral artery. Nonvisualized right posterior inferior cerebellar artery. Mild to moderate narrowing proximal left posterior inferior cerebellar artery. Poor delineation of majority of the anterior inferior cerebellar arteries. No significant narrowing of the basilar artery. No aneurysm noted. IMPRESSION: MRI HEAD No acute infarct or intracranial hemorrhage. Mild chronic microvascular changes unchanged from prior exam. Persistent opacification left middle ear and left mastoid air cells without obstructing lesion of the eustachian tube noted. Minimal mucosal thickening paranasal sinuses. MRA HEAD Mild narrowing proximal cavernous segment left internal carotid artery. Mild narrowing supraclinoid segment right internal carotid artery.  Slightly hypoplastic A1 segment right anterior cerebral artery. Middle cerebral artery mild branch vessel irregularity and  mild narrowing bilaterally. Mild narrowing distal right vertebral artery. Nonvisualized right posterior inferior cerebellar artery. Mild to moderate narrowing proximal left posterior inferior cerebellar artery. Poor delineation of majority of the anterior inferior cerebellar arteries. No significant narrowing of the basilar artery. Electronically Signed   By: Lacy Duverney M.D.   On: 04/21/2016 12:44  Ct Head Code Stroke W/o Cm  Result Date: 04/21/2016 CLINICAL DATA:  60 year old male with code stroke. EXAM: CT HEAD WITHOUT CONTRAST TECHNIQUE: Contiguous axial images were obtained from the base of the skull through the vertex without intravenous contrast. COMPARISON:  Head CT dated 10/20/2009 and brain MRI dated 04/02/2016 FINDINGS: The ventricles and the sulci are appropriate in size for the patient's age. There is no intracranial hemorrhage. No midline shift or mass effect identified. The gray-white matter differentiation is preserved. The visualized paranasal sinuses and mastoid air cells are well aerated. The calvarium is intact. IMPRESSION: No acute intracranial pathology. These results were called by telephone at the time of interpretation on 04/21/2016 at 4:07 am to Dr. Roseanne Reno, who verbally acknowledged these results. Electronically Signed   By: Elgie Collard M.D.   On: 04/21/2016 04:09   Scheduled Meds: . aspirin  300 mg Rectal Daily   Or  . aspirin  325 mg Oral Daily  . atorvastatin  40 mg Oral QHS  . enoxaparin (LOVENOX) injection  40 mg Subcutaneous Q24H  . insulin aspart  0-5 Units Subcutaneous QHS  . insulin aspart  0-9 Units Subcutaneous TID WC  . insulin aspart  5 Units Subcutaneous TID WC  . insulin glargine  20 Units Subcutaneous Daily  . nicotine  21 mg Transdermal Daily  . topiramate  50 mg Oral BID  . valproic acid  500 mg Oral BID   Continuous  Infusions: . sodium chloride 75 mL/hr at 04/22/16 1307    Time spent: 30 minutes    Vassie Loll  [facility] Pager [phone]. If 7PM-7AM, please contact night-coverage at [email or web address], password [facility] 04/22/2016, 5:39 PM  LOS: 0 days

## 2016-04-22 NOTE — Progress Notes (Signed)
**  Preliminary report by tech**  Carotid artery duplex completed. Bilateral ICA's demonstrated mild intimal thickening with homogenous plaque formations suggestive of a 1-39% category. Vertebral arteries appear patent with antegrade flow.   04/22/16 10:12 AM Olen Cordial RVT

## 2016-04-22 NOTE — Progress Notes (Signed)
Physical Therapy Treatment Patient Details Name: Mike Stewart MRN: [id number] DOB: 09-10-1956 Today's Date: 04/22/2016    History of Present Illness Mike Stewart a 60 y.o.malewith medical history significant of hyperlipidemia, diabetes mellitus, OSA not using CPAP, syncope, right bundle blockage, tobacco abuse, s/p of heart monitor placement, who was admitted with slurred speech, right facial droop, right arm and leg numbness and weakness.    PT Comments    Pt remains to have a 80% sensation deficits in R hand and R foot limiting pts ambulation tolerance and ability to complete stair negotiation. Pt became winded and dizzy upon stair negotiation requiring standing rest break. Pt con't to report 9/10 headache t/o session. Pt reports blood sugar to be in 300s. PT to con't to follow to address higher level function as pt works in [employer].   Follow Up Recommendations  Outpatient PT;Supervision - Intermittent;Other (comment)     Equipment Recommendations  None recommended by PT    Recommendations for Other Services       Precautions / Restrictions Precautions Precautions: Fall Restrictions Weight Bearing Restrictions: No    Mobility  Bed Mobility Overal bed mobility: Independent                Transfers Overall transfer level: Needs assistance Equipment used: None Transfers: Sit to/from Stand Sit to Stand: Supervision         General transfer comment: supervision due to mild lean to R  Ambulation/Gait Ambulation/Gait assistance: Min guard Ambulation Distance (Feet): 200 Feet Assistive device: None Gait Pattern/deviations: Step-through pattern;Decreased stride length;Staggering right Gait velocity: mildly decreased   General Gait Details: pt mildly guarded, no episodes of LOB however vearing to the R and reaching for hallway rail with L   Stairs Stairs: Yes Stairs assistance: Min guard Stair Management: One rail Right;Alternating pattern;Step to  pattern;Forwards Number of Stairs: 10 General stair comments: pt able to ascend reciprocally but had to use step to to descend as patient unable to feel R LE, pt with report "it feels like it'll give out on me". pt very winded at end of stairs and required 3 min standing rest break due to dizziness  Wheelchair Mobility    Modified Rankin (Stroke Patients Only) Modified Rankin (Stroke Patients Only) Pre-Morbid Rankin Score: No symptoms Modified Rankin: Slight disability     Balance     Sitting balance-Leahy Scale: Good     Standing balance support: No upper extremity supported Standing balance-Leahy Scale: Good                      Cognition Arousal/Alertness: Awake/alert Behavior During Therapy: WFL for tasks assessed/performed Overall Cognitive Status: Within Functional Limits for tasks assessed                      Exercises      General Comments        Pertinent Vitals/Pain Pain Assessment: No/denies pain    Home Living                      Prior Function            PT Goals (current goals can now be found in the care plan section) Acute Rehab PT Goals Patient Stated Goal: return to work Progress towards PT goals: Progressing toward goals    Frequency  Min 4X/week    PT Plan Current plan remains appropriate    Co-evaluation  End of Session Equipment Utilized During Treatment: Gait belt Activity Tolerance: Patient limited by fatigue Patient left: in bed;with family/visitor present;with call bell/phone within reach     Time: 1205-1220 PT Time Calculation (min) (ACUTE ONLY): 15 min  Charges:  $Gait Training: 8-22 mins                    G Codes:      Marcene Brawn 04/22/2016, 12:34 PM   Lewis Shock, PT, DPT Pager #: [phone] Office #: [phone]

## 2016-04-22 NOTE — Progress Notes (Signed)
Md notified that pt stated that his has no relief from his headache with Tramadol or Imitrex. Md to put order in for something else for comfort. Pt no noted distress.

## 2016-04-22 NOTE — Progress Notes (Signed)
Pt reports that he monitors his CBG at home. He states that he checks daily before meals and at before bedtime. No noted signs or symptoms of hypo/hyperglycemia. Will continue to monitor.

## 2016-04-23 DIAGNOSIS — E162 Hypoglycemia, unspecified: Secondary | ICD-10-CM

## 2016-04-23 DIAGNOSIS — E119 Type 2 diabetes mellitus without complications: Secondary | ICD-10-CM | POA: Diagnosis not present

## 2016-04-23 DIAGNOSIS — G43109 Migraine with aura, not intractable, without status migrainosus: Secondary | ICD-10-CM | POA: Diagnosis not present

## 2016-04-23 LAB — GLUCOSE, CAPILLARY
GLUCOSE-CAPILLARY: 99 mg/dL (ref 65–99)
GLUCOSE-CAPILLARY: 71 mg/dL (ref 65–99)
GLUCOSE-CAPILLARY: 74 mg/dL (ref 65–99)
GLUCOSE-CAPILLARY: 174 mg/dL — AB (ref 65–99)
Glucose-Capillary: 211 mg/dL — ABNORMAL HIGH (ref 65–99)

## 2016-04-23 LAB — BASIC METABOLIC PANEL
Anion gap: 7 (ref 5–15)
BUN: 13 mg/dL (ref 6–20)
CHLORIDE: 105 mmol/L (ref 101–111)
CO2: 25 mmol/L (ref 22–32)
CREATININE: 0.91 mg/dL (ref 0.61–1.24)
Calcium: 8.8 mg/dL — ABNORMAL LOW (ref 8.9–10.3)
GFR calc non Af Amer: >60 mL/min (ref >60)
Glucose, Bld: 58 mg/dL — ABNORMAL LOW (ref 65–99)
Potassium: 4.2 mmol/L (ref 3.5–5.1)
SODIUM: 137 mmol/L (ref 135–145)

## 2016-04-23 LAB — HEMOGLOBIN A1C
Hgb A1c MFr Bld: 8.9 % — ABNORMAL HIGH (ref 4.8–5.6)
Mean Plasma Glucose: 209 mg/dL

## 2016-04-23 MED ORDER — VALPROIC ACID 250 MG PO CAPS: 500.0000 mg | ORAL_CAPSULE | Freq: Two times a day (BID) | ORAL | 0 refills | Status: AC

## 2016-04-23 MED ORDER — SUMATRIPTAN SUCCINATE 50 MG PO TABS: 50.0000 mg | ORAL_TABLET | Freq: Three times a day (TID) | ORAL | 0 refills | Status: DC | PRN

## 2016-04-23 MED ORDER — TOPIRAMATE 50 MG PO TABS: 50.0000 mg | ORAL_TABLET | Freq: Two times a day (BID) | ORAL | 0 refills | Status: DC

## 2016-04-23 MED ORDER — ASPIRIN 325 MG PO TABS: 325.0000 mg | ORAL_TABLET | Freq: Every day | ORAL | 0 refills | Status: AC

## 2016-04-23 MED ORDER — INSULIN GLARGINE 100 UNIT/ML ~~LOC~~ SOLN: 15.0000 [IU] | Freq: Every day | SUBCUTANEOUS | 11 refills | Status: AC

## 2016-04-23 NOTE — Progress Notes (Signed)
Physical Therapy Treatment Patient Details Name: Oluwajomiloju Dellaporta MRN: [id number] DOB: July 11, 1956 Today's Date: 04/23/2016    History of Present Illness Sujay Donn a 60 y.o.malewith medical history significant of hyperlipidemia, diabetes mellitus, OSA not using CPAP, syncope, right bundle blockage, tobacco abuse, s/p of heart monitor placement, who was admitted with slurred speech, right facial droop, right arm and leg numbness and weakness.    PT Comments    Patient is progressing toward goals. Scored 44 on Berg Balance Test indicating low risk for falls. Current plan remains appropriate.   Follow Up Recommendations  Outpatient PT;Supervision - Intermittent;Other (comment)     Equipment Recommendations  None recommended by PT    Recommendations for Other Services       Precautions / Restrictions Precautions Precautions: Fall Restrictions Weight Bearing Restrictions: No    Mobility  Bed Mobility Overal bed mobility: Independent                Transfers Overall transfer level: Modified independent Equipment used: None                Ambulation/Gait Ambulation/Gait assistance: Min guard Ambulation Distance (Feet): 300 Feet Assistive device: None Gait Pattern/deviations: Step-through pattern;Decreased stride length     General Gait Details: pt with slow, steady gait and appeared guarded likely due to light sensitivity in hallway; min guard for safety   Stairs Stairs: Yes Stairs assistance: Min guard Stair Management: One rail Right;Forwards Number of Stairs: 5 General stair comments: cues for step to pattern and safety  Wheelchair Mobility    Modified Rankin (Stroke Patients Only) Modified Rankin (Stroke Patients Only) Pre-Morbid Rankin Score: No symptoms Modified Rankin: Slight disability     Balance     Sitting balance-Leahy Scale: Good       Standing balance-Leahy Scale: Good                      Cognition  Arousal/Alertness: Awake/alert Behavior During Therapy: WFL for tasks assessed/performed Overall Cognitive Status: Within Functional Limits for tasks assessed                      Exercises      General Comments        Pertinent Vitals/Pain Pain Assessment: 0-10 Pain Score: 8  Pain Location: HA Pain Descriptors / Indicators: Aching;Grimacing;Headache;Other (Comment) (light sensitivity)    Home Living                      Prior Function            PT Goals (current goals can now be found in the care plan section) Acute Rehab PT Goals Patient Stated Goal: go home Progress towards PT goals: Progressing toward goals    Frequency  Min 4X/week    PT Plan Current plan remains appropriate    Co-evaluation             End of Session Equipment Utilized During Treatment: Gait belt Activity Tolerance: Patient limited by pain Patient left: in bed;with call bell/phone within reach     Time: 1015-1040 PT Time Calculation (min) (ACUTE ONLY): 25 min  Charges:  $Gait Training: 8-22 mins $Therapeutic Activity: 8-22 mins                    G Codes:      Derek Mound, PTA Pager: [phone]   04/23/2016, 11:25 AM

## 2016-04-23 NOTE — Discharge Summary (Signed)
Physician Discharge Summary  Mike Stewart WUJ:[id number] DOB: [birth year] DOA: 04/21/2016  PCP: Maryelizabeth Rowan, MD  Admit date: 04/21/2016 Discharge date: 04/23/2016  Admitted From:Home Disposition:  Home  Recommendations for Outpatient Follow-up:  1. Follow up with PCP in 1-2 weeks 2. Please obtain BMP/CBC in one week 3. Please follow up on the following pending results:  Home Health: No Equipment/Devices: None applicable  Discharge Condition: Stable CODE STATUS: Full Diet recommendation: Heart healthy and diabetic 1800-calorie ADA  Brief/Interim Summary: This is a 60 year old male who presented to the hospital with the chief complaint of slurred speech, right facial drop, right arm/ leg numbness and weakness. His serum glucose was 35, after receiving dextrose had improvement of his symptoms but with persistent paresthesias and weakness on his right upper extremity. On his initial physical examination, patient was afebrile, his blood pressure was 136/77, heart 78, respiratory 16. His lungs were clear to auscultation, heart S1-S2 present and rhythmic, he was mildly confused, cranial nerves were grossly intact 2 through 12, muscle strength for 3 or 5 right arm and leg with decreased sensation to light touch in right arm and leg. His serum sodium was 138, potassium 3.6, creatinine 0.73, glucose 131, his EKG was normal sinus rhythm, his CT head was negative for acute intracranial findings..  Patient was admitted to hospital working diagnosis of stroke completed by hypoglycemia.  1. Strokelike symptoms. It was concluded that patient made had complex migraine to rule out transitory ischemic attack. Patient has been placed on aspirin for secondary prophylaxis. Echocardiogram was negative for intracardiac thrombus. MRI MRA was negative for acute ischemia. Patient was seen by physical therapy with good toleration. Patient was consulted by neurology, patient underwent EEG with no seizures recorded. He  was started on Depakote 500 mg by mouth twice a day for migraine and seizure prevention. Patient will need follow-up in 2 months by Latrelle Dodrill at [facility]. Continue statin therapy.  2. Diabetes mellitus type 2. Patient has been resumed on his long-acting insulin, it was recommended to use 15 units at night to avoid hypoglycemia, continue insulin sliding scale and follow-up with endocrinology as an outpatient.  3. Hypothyroidism, patient remained euthyroid, patient will continue levothyroxine.  4. Tobacco abuse, smoking cessation counseling provided.  Discharge Diagnoses:  Principal Problem:   Stroke [facility]) Active Problems:   HLD (hyperlipidemia)   Hypoglycemia   Diabetes mellitus without complication (HCC)   Tobacco abuse   Stroke-like symptoms   Seizures (HCC)   Migraine with aura and without status migrainosus, not intractable    Discharge Instructions  Discharge Instructions    Ambulatory referral to Neurology    Complete by:  As directed   Pt will follow up with carolyn martin NP at [facility] in about 2 months. Thanks.   Diet - low sodium heart healthy    Complete by:  As directed   Discharge instructions    Complete by:  As directed   Please use insulin lantus 15 units at night and continue to monitor glucose. Keep glucose between 120 and 160.   Increase activity slowly    Complete by:  As directed       Medication List    TAKE these medications   aspirin 325 MG tablet Take 1 tablet (325 mg total) by mouth daily.   atorvastatin 40 MG tablet Commonly known as:  LIPITOR Take 40 mg by mouth at bedtime.   insulin aspart 100 UNIT/ML injection Commonly known as:  novoLOG Inject 1-15 Units into the skin 3 (  three) times daily with meals. Sliding scale   insulin glargine 100 UNIT/ML injection Commonly known as:  LANTUS Inject 0.15 mLs (15 Units total) into the skin at bedtime. What changed:  how much to take   levothyroxine 88 MCG tablet Commonly known as:  SYNTHROID,  LEVOTHROID Take 88 mcg by mouth daily.   loratadine 10 MG tablet Commonly known as:  CLARITIN Take 10 mg by mouth daily.   valproic acid 250 MG capsule Commonly known as:  DEPAKENE Take 2 capsules (500 mg total) by mouth 2 (two) times daily.      Follow-up Information    Nilda Riggs, NP. Schedule an appointment as soon as possible for a visit in 2 month(s).   Specialty:  Family Medicine Contact information: [address] [place] [address] [phone]          Allergies  Allergen Reactions  . Omnipaque [Iohexol]     Patient can be pre-medicated  . Phenobarbital     Consultations:  Neurology.   Procedures/Studies: Dg Chest 2 View  Result Date: 04/21/2016 CLINICAL DATA:  Shortness of Breath EXAM: CHEST  2 VIEW COMPARISON:  01/06/2011 FINDINGS: The heart size and mediastinal contours are within normal limits. Both lungs are clear. The visualized skeletal structures are unremarkable. Loop recorder is now seen anteriorly. IMPRESSION: No active cardiopulmonary disease. Electronically Signed   By: Alcide Clever M.D.   On: 04/21/2016 10:08  Mr Brain Wo Contrast  Result Date: 04/21/2016 CLINICAL DATA:  60 year old hypertensive diabetic male with hyperlipidemia and tobacco abuse presenting with slurred speech, right facial droop, right arm and leg numbness and weakness. Subsequent encounter. EXAM: MRI HEAD WITHOUT CONTRAST MRA HEAD WITHOUT CONTRAST TECHNIQUE: Multiplanar, multiecho pulse sequences of the brain and surrounding structures were obtained without intravenous contrast. Angiographic images of the head were obtained using MRA technique without contrast. COMPARISON:  04/21/2016 head CT.  04/02/2016 brain MR. FINDINGS: MRI HEAD FINDINGS No acute infarct or intracranial hemorrhage. Scattered punctate nonspecific white matter changes probably related to result of chronic microvascular changes unchanged from prior exam. No intracranial mass lesion noted  on this unenhanced exam. No hydrocephalus. Persistent opacification left middle ear and left mastoid air cells without obstructing lesion of the eustachian tube noted. Minimal mucosal thickening paranasal sinuses. No acute orbital abnormality noted. Cervical medullary junction within normal limits. Mild cervical spondylotic changes upper cervical spine. Major intracranial vascular structures are patent. MRA HEAD FINDINGS Mild narrowing proximal cavernous segment left internal carotid artery. Mild narrowing supraclinoid segment right internal carotid artery. Slightly hypoplastic A1 segment right anterior cerebral artery. Middle cerebral artery mild branch vessel irregularity and mild narrowing bilaterally. Ectatic vertebral arteries. Left vertebral artery slightly dominant in size. Mild narrowing distal right vertebral artery. Nonvisualized right posterior inferior cerebellar artery. Mild to moderate narrowing proximal left posterior inferior cerebellar artery. Poor delineation of majority of the anterior inferior cerebellar arteries. No significant narrowing of the basilar artery. No aneurysm noted. IMPRESSION: MRI HEAD No acute infarct or intracranial hemorrhage. Mild chronic microvascular changes unchanged from prior exam. Persistent opacification left middle ear and left mastoid air cells without obstructing lesion of the eustachian tube noted. Minimal mucosal thickening paranasal sinuses. MRA HEAD Mild narrowing proximal cavernous segment left internal carotid artery. Mild narrowing supraclinoid segment right internal carotid artery. Slightly hypoplastic A1 segment right anterior cerebral artery. Middle cerebral artery mild branch vessel irregularity and mild narrowing bilaterally. Mild narrowing distal right vertebral artery. Nonvisualized right posterior inferior cerebellar artery. Mild to moderate  narrowing proximal left posterior inferior cerebellar artery. Poor delineation of majority of the anterior  inferior cerebellar arteries. No significant narrowing of the basilar artery. Electronically Signed   By: Steven  OlsonLacy Duverney: 04/21/2016 12:44  Mr Maxine Glenn Head/brain WG Cm  Result Date: 04/21/2016 CLINICAL DATA:  60 year old hypertensive diabetic male with hyperlipidemia and tobacco abuse presenting with slurred speech, right facial droop, right arm and leg numbness and weakness. Subsequent encounter. EXAM: MRI HEAD WITHOUT CONTRAST MRA HEAD WITHOUT CONTRAST TECHNIQUE: Multiplanar, multiecho pulse sequences of the brain and surrounding structures were obtained without intravenous contrast. Angiographic images of the head were obtained using MRA technique without contrast. COMPARISON:  04/21/2016 head CT.  04/02/2016 brain MR. FINDINGS: MRI HEAD FINDINGS No acute infarct or intracranial hemorrhage. Scattered punctate nonspecific white matter changes probably related to result of chronic microvascular changes unchanged from prior exam. No intracranial mass lesion noted on this unenhanced exam. No hydrocephalus. Persistent opacification left middle ear and left mastoid air cells without obstructing lesion of the eustachian tube noted. Minimal mucosal thickening paranasal sinuses. No acute orbital abnormality noted. Cervical medullary junction within normal limits. Mild cervical spondylotic changes upper cervical spine. Major intracranial vascular structures are patent. MRA HEAD FINDINGS Mild narrowing proximal cavernous segment left internal carotid artery. Mild narrowing supraclinoid segment right internal carotid artery. Slightly hypoplastic A1 segment right anterior cerebral artery. Middle cerebral artery mild branch vessel irregularity and mild narrowing bilaterally. Ectatic vertebral arteries. Left vertebral artery slightly dominant in size. Mild narrowing distal right vertebral artery. Nonvisualized right posterior inferior cerebellar artery. Mild to moderate narrowing proximal left posterior inferior  cerebellar artery. Poor delineation of majority of the anterior inferior cerebellar arteries. No significant narrowing of the basilar artery. No aneurysm noted. IMPRESSION: MRI HEAD No acute infarct or intracranial hemorrhage. Mild chronic microvascular changes unchanged from prior exam. Persistent opacification left middle ear and left mastoid air cells without obstructing lesion of the eustachian tube noted. Minimal mucosal thickening paranasal sinuses. MRA HEAD Mild narrowing proximal cavernous segment left internal carotid artery. Mild narrowing supraclinoid segment right internal carotid artery. Slightly hypoplastic A1 segment right anterior cerebral artery. Middle cerebral artery mild branch vessel irregularity and mild narrowing bilaterally. Mild narrowing distal right vertebral artery. Nonvisualized right posterior inferior cerebellar artery. Mild to moderate narrowing proximal left posterior inferior cerebellar artery. Poor delineation of majority of the anterior inferior cerebellar arteries. No significant narrowing of the basilar artery. Electronically Signed   By: Lacy Duverney M.D.   On: 04/21/2016 12:44  Ct Head Code Stroke W/o Cm  Result Date: 04/21/2016 CLINICAL DATA:  60 year old male with code stroke. EXAM: CT HEAD WITHOUT CONTRAST TECHNIQUE: Contiguous axial images were obtained from the base of the skull through the vertex without intravenous contrast. COMPARISON:  Head CT dated 10/20/2009 and brain MRI dated 04/02/2016 FINDINGS: The ventricles and the sulci are appropriate in size for the patient's age. There is no intracranial hemorrhage. No midline shift or mass effect identified. The gray-white matter differentiation is preserved. The visualized paranasal sinuses and mastoid air cells are well aerated. The calvarium is intact. IMPRESSION: No acute intracranial pathology. These results were called by telephone at the time of interpretation on 04/21/2016 at 4:07 am to Dr. Roseanne Reno, who  verbally acknowledged these results. Electronically Signed   By: Elgie Collard M.D.   On: 04/21/2016 04:09   (Echo, Carotid, EGD, Colonoscopy, ERCP)    Subjective:   Discharge Exam: Vitals:   04/23/16 0111 04/23/16 0532  BP:  117/63 117/71  Pulse: (!) 56 66  Resp: 18 18  Temp: 97.4 F (36.3 C) 97.2 F (36.2 C)   Vitals:   04/22/16 1730 04/22/16 2119 04/23/16 0111 04/23/16 0532  BP: 121/71 110/69 117/63 117/71  Pulse: 63 61 (!) 56 66  Resp: (!) 63 18 18 18   Temp: 97.6 F (36.4 C) 97.9 F (36.6 C) 97.4 F (36.3 C) 97.2 F (36.2 C)  TempSrc: Oral Oral Oral Oral  SpO2: 98% 96% 99% 98%  Weight:      Height:        General: Pt is alert, awake, not in acute distress Cardiovascular: RRR, S1/S2 +, no rubs, no gallops Respiratory: CTA bilaterally, no wheezing, no rhonchi Abdominal: Soft, NT, ND, bowel sounds + Extremities: no edema, no cyanosis    The results of significant diagnostics from this hospitalization (including imaging, microbiology, ancillary and laboratory) are listed below for reference.     Microbiology: No results found for this or any previous visit (from the past 240 hour(s)).   Labs: BNP (last 3 results) No results for input(s): BNP in the last 8760 hours. Basic Metabolic Panel:  Recent Labs Lab 04/21/16 0350 04/21/16 0355 04/23/16 0256  NA 138 139 137  K 3.6 3.6 4.2  CL 108 105 105  CO2 23  --  25  GLUCOSE 131* 128* 58*  BUN 16 18 13   CREATININE 0.73 0.80 0.91  CALCIUM 8.8*  --  8.8*   Liver Function Tests:  Recent Labs Lab 04/21/16 0350  AST 28  ALT 24  ALKPHOS 51  BILITOT 0.8  PROT 5.6*  ALBUMIN 3.6   No results for input(s): LIPASE, AMYLASE in the last 168 hours. No results for input(s): AMMONIA in the last 168 hours. CBC:  Recent Labs Lab 04/21/16 0350 04/21/16 0355  WBC 11.6*  --   NEUTROABS 9.3*  --   HGB 14.5 14.6  HCT 40.9 43.0  MCV 95.3  --   PLT 131*  --    Cardiac Enzymes: No results for input(s):  CKTOTAL, CKMB, CKMBINDEX, TROPONINI in the last 168 hours. BNP: Invalid input(s): POCBNP CBG:  Recent Labs Lab 04/22/16 1445 04/22/16 1622 04/22/16 2017 04/23/16 0623 04/23/16 0752  GLUCAP 261* 229* 99 71 74   D-Dimer No results for input(s): DDIMER in the last 72 hours. Hgb A1c  Recent Labs  04/22/16 0613  HGBA1C 8.9*   Lipid Profile  Recent Labs  04/22/16 0613  CHOL 139  HDL 39*  LDLCALC 77  TRIG 829  CHOLHDL 3.6   Thyroid function studies  Recent Labs  04/22/16 1940  TSH 3.195   Anemia work up No results for input(s): VITAMINB12, FOLATE, FERRITIN, TIBC, IRON, RETICCTPCT in the last 72 hours. Urinalysis    Component Value Date/Time   COLORURINE YELLOW 04/21/2016 1330   APPEARANCEUR CLEAR 04/21/2016 1330   LABSPEC 1.023 04/21/2016 1330   PHURINE 6.5 04/21/2016 1330   GLUCOSEU >1000 (A) 04/21/2016 1330   HGBUR NEGATIVE 04/21/2016 1330   BILIRUBINUR NEGATIVE 04/21/2016 1330   KETONESUR NEGATIVE 04/21/2016 1330   PROTEINUR NEGATIVE 04/21/2016 1330   UROBILINOGEN 0.2 07/04/2010 1520   NITRITE NEGATIVE 04/21/2016 1330   LEUKOCYTESUR NEGATIVE 04/21/2016 1330   Sepsis Labs Invalid input(s): PROCALCITONIN,  WBC,  LACTICIDVEN Microbiology No results found for this or any previous visit (from the past 240 hour(s)).   Time coordinating discharge: 45 minutes  SIGNED:   Coralie Keens, MD  [facility] 04/23/2016, 8:45 AM Pager  If 7PM-7AM, please contact night-coverage [email or web address] Password [email or web address]

## 2016-04-23 NOTE — Care Management Note (Signed)
Case Management Note  Patient Details  Name: Mike Stewart MRN: [id number] Date of Birth: Mar 02, 1956  Subjective/Objective:                    Action/Plan: Pt discharging home with self care. PT recommending outpatient therapy. CM met with the patient and asked him about outpatient therapy and he refuses at this time. CM encouraged him to contact his PCP if he changes his mind. No further needs per CM.   Expected Discharge Date:                  Expected Discharge Plan:  Home/Self Care  In-House Referral:     Discharge planning Services  CM Consult  Post Acute Care Choice:    Choice offered to:     DME Arranged:    DME Agency:     HH Arranged:    [place] Agency:     Status of Service:  Completed, signed off  If discussed at [employer] of Stay Meetings, dates discussed:    Additional Comments:  Pollie Friar, RN 04/23/2016, 11:02 AM

## 2016-04-23 NOTE — Progress Notes (Signed)
Pt discharging at this time with son taking all personal belongings. IV discontinued, dry dressing applied. Discharge instructions provided with prescriptions with verbal understanding. Pt aware of follow up appts. No noted distress.

## 2016-04-25 ENCOUNTER — Encounter (HOSPITAL_BASED_OUTPATIENT_CLINIC_OR_DEPARTMENT_OTHER): Payer: Self-pay

## 2016-11-07 ENCOUNTER — Other Ambulatory Visit (HOSPITAL_COMMUNITY): Payer: Self-pay | Admitting: Ophthalmology

## 2016-11-07 DIAGNOSIS — H349 Unspecified retinal vascular occlusion: Secondary | ICD-10-CM

## 2016-11-11 ENCOUNTER — Ambulatory Visit (HOSPITAL_COMMUNITY)
Admission: RE | Admit: 2016-11-11 | Discharge: 2016-11-11 | Disposition: A | Discharge status: Home / Self Care | Payer: 59 | Source: Ambulatory Visit | Attending: Family Medicine | Admitting: Family Medicine

## 2016-11-11 DIAGNOSIS — I6523 Occlusion and stenosis of bilateral carotid arteries: Secondary | ICD-10-CM | POA: Diagnosis not present

## 2016-11-11 DIAGNOSIS — H349 Unspecified retinal vascular occlusion: Secondary | ICD-10-CM

## 2016-11-11 DIAGNOSIS — H3563 Retinal hemorrhage, bilateral: Secondary | ICD-10-CM | POA: Diagnosis present

## 2016-11-11 LAB — VAS US CAROTID
LCCAPDIAS: 25 cm/s
LEFT VERTEBRAL DIAS: -18 cm/s
LICADDIAS: -22 cm/s
LICADSYS: -72 cm/s
LICAPDIAS: -37 cm/s
LICAPSYS: -111 cm/s
Left CCA dist dias: -24 cm/s
Left CCA dist sys: -96 cm/s
Left CCA prox sys: 128 cm/s
RIGHT ECA DIAS: -17 cm/s
RIGHT VERTEBRAL DIAS: -20 cm/s
Right CCA prox dias: 20 cm/s
Right CCA prox sys: 110 cm/s
Right cca dist sys: -78 cm/s

## 2016-11-11 NOTE — Progress Notes (Signed)
**  Preliminary report by tech**  Carotid artery duplex complete. Findings are consistent with a 1-39 percent stenosis involving the right internal carotid artery and the left internal carotid artery. The vertebral arteries demonstrate antegrade flow.  11/11/16 11:56 AM Olen CordialGreg Zaley Talley RVT

## 2018-08-29 ENCOUNTER — Encounter (HOSPITAL_BASED_OUTPATIENT_CLINIC_OR_DEPARTMENT_OTHER): Payer: Self-pay | Admitting: Emergency Medicine

## 2018-08-29 ENCOUNTER — Other Ambulatory Visit: Payer: Self-pay

## 2018-08-29 ENCOUNTER — Emergency Department (HOSPITAL_BASED_OUTPATIENT_CLINIC_OR_DEPARTMENT_OTHER): Payer: Self-pay

## 2018-08-29 ENCOUNTER — Emergency Department (HOSPITAL_BASED_OUTPATIENT_CLINIC_OR_DEPARTMENT_OTHER)
Admission: EM | Admit: 2018-08-29 | Discharge: 2018-08-29 | Disposition: A | Discharge status: Home / Self Care | Payer: Self-pay | Attending: Emergency Medicine | Admitting: Emergency Medicine

## 2018-08-29 DIAGNOSIS — E119 Type 2 diabetes mellitus without complications: Secondary | ICD-10-CM | POA: Insufficient documentation

## 2018-08-29 DIAGNOSIS — Z794 Long term (current) use of insulin: Secondary | ICD-10-CM | POA: Insufficient documentation

## 2018-08-29 DIAGNOSIS — Z8673 Personal history of transient ischemic attack (TIA), and cerebral infarction without residual deficits: Secondary | ICD-10-CM | POA: Insufficient documentation

## 2018-08-29 DIAGNOSIS — Z79899 Other long term (current) drug therapy: Secondary | ICD-10-CM | POA: Insufficient documentation

## 2018-08-29 DIAGNOSIS — N12 Tubulo-interstitial nephritis, not specified as acute or chronic: Secondary | ICD-10-CM | POA: Insufficient documentation

## 2018-08-29 DIAGNOSIS — F17210 Nicotine dependence, cigarettes, uncomplicated: Secondary | ICD-10-CM | POA: Insufficient documentation

## 2018-08-29 DIAGNOSIS — R310 Gross hematuria: Secondary | ICD-10-CM | POA: Insufficient documentation

## 2018-08-29 LAB — CBC WITH DIFFERENTIAL/PLATELET
Abs Immature Granulocytes: 0.08 10*3/uL — ABNORMAL HIGH (ref 0.00–0.07)
Basophils Absolute: 0 10*3/uL (ref 0.0–0.1)
Basophils Relative: 0 %
Eosinophils Absolute: 0.2 10*3/uL (ref 0.0–0.5)
Eosinophils Relative: 1 %
HCT: 42.4 % (ref 39.0–52.0)
Hemoglobin: 14.1 g/dL (ref 13.0–17.0)
Immature Granulocytes: 0 %
Lymphocytes Relative: 7 %
Lymphs Abs: 1.3 10*3/uL (ref 0.7–4.0)
MCH: 33.2 pg (ref 26.0–34.0)
MCHC: 33.3 g/dL (ref 30.0–36.0)
MCV: 99.8 fL (ref 80.0–100.0)
Monocytes Absolute: 1.1 10*3/uL — ABNORMAL HIGH (ref 0.1–1.0)
Monocytes Relative: 6 %
Neutro Abs: 15.9 10*3/uL — ABNORMAL HIGH (ref 1.7–7.7)
Neutrophils Relative %: 86 %
Platelets: 122 10*3/uL — ABNORMAL LOW (ref 150–400)
RBC: 4.25 MIL/uL (ref 4.22–5.81)
RDW: 13.2 % (ref 11.5–15.5)
WBC: 18.6 10*3/uL — ABNORMAL HIGH (ref 4.0–10.5)
nRBC: 0 % (ref 0.0–0.2)

## 2018-08-29 LAB — URINALYSIS, ROUTINE W REFLEX MICROSCOPIC
Glucose, UA: NEGATIVE mg/dL
KETONES UR: 15 mg/dL — AB
Nitrite: POSITIVE — AB
Protein, ur: 100 mg/dL — AB
Specific Gravity, Urine: 1.02 (ref 1.005–1.030)
pH: 7 (ref 5.0–8.0)

## 2018-08-29 LAB — BASIC METABOLIC PANEL
Anion gap: 6 (ref 5–15)
BUN: 18 mg/dL (ref 6–20)
CO2: 23 mmol/L (ref 22–32)
Calcium: 8.6 mg/dL — ABNORMAL LOW (ref 8.9–10.3)
Chloride: 107 mmol/L (ref 98–111)
Creatinine, Ser: 0.9 mg/dL (ref 0.61–1.24)
GFR calc Af Amer: >60 mL/min (ref >60)
GFR calc non Af Amer: >60 mL/min (ref >60)
Glucose, Bld: 177 mg/dL — ABNORMAL HIGH (ref 70–99)
Potassium: 4 mmol/L (ref 3.5–5.1)
Sodium: 136 mmol/L (ref 135–145)

## 2018-08-29 LAB — URINALYSIS, MICROSCOPIC (REFLEX)
RBC / HPF: >50 RBC/hpf (ref 0–5)
WBC, UA: >50 WBC/hpf (ref 0–5)

## 2018-08-29 MED ORDER — ONDANSETRON HCL 4 MG PO TABS: 4.0000 mg | ORAL_TABLET | Freq: Four times a day (QID) | ORAL | 0 refills | Status: AC

## 2018-08-29 MED ORDER — CEPHALEXIN 500 MG PO CAPS: 500.0000 mg | ORAL_CAPSULE | Freq: Four times a day (QID) | ORAL | 0 refills | Status: AC

## 2018-08-29 MED ORDER — SODIUM CHLORIDE 0.9 % IV SOLN
1.0000 g | Freq: Once | INTRAVENOUS | Status: AC
  Administered 2018-08-29: 1 g via INTRAVENOUS
  Filled 2018-08-29: qty 10

## 2018-08-29 MED ORDER — HYDROCODONE-ACETAMINOPHEN 5-325 MG PO TABS: 1.0000 | ORAL_TABLET | Freq: Four times a day (QID) | ORAL | 0 refills | Status: AC | PRN

## 2018-08-29 MED ORDER — MORPHINE SULFATE (PF) 4 MG/ML IV SOLN
4.0000 mg | Freq: Once | INTRAVENOUS | Status: AC
  Administered 2018-08-29: 4 mg via INTRAVENOUS
  Filled 2018-08-29: qty 1

## 2018-08-29 MED ORDER — SODIUM CHLORIDE 0.9 % IV BOLUS
1000.0000 mL | Freq: Once | INTRAVENOUS | Status: AC
  Administered 2018-08-29: 1000 mL via INTRAVENOUS

## 2018-08-29 MED ORDER — KETOROLAC TROMETHAMINE 30 MG/ML IJ SOLN
15.0000 mg | Freq: Once | INTRAMUSCULAR | Status: AC
  Administered 2018-08-29: 15 mg via INTRAVENOUS
  Filled 2018-08-29: qty 1

## 2018-08-29 NOTE — ED Provider Notes (Signed)
[facility] EMERGENCY DEPARTMENT Provider Note   CSN: [id number] Arrival date & time: 08/29/18  [id number]     History   Chief Complaint Chief Complaint  Patient presents with  . Hematuria    HPI Mike Stewart is a 62 y.o. male with history of diabetes, OSA who presents with a 2-day history of urinary frequency, dysuria, and hematuria.  He has had associated right flank pain.  He has seen some blood clots in his urine.  Patient has no history of UTI.  He does have history of kidney stone several years ago.  He is sexually active with his wife only.  He denies any scrotal pain or swelling.  He denies any nausea, vomiting, chest pain, shortness of breath.  He feels pressure in his suprapubic area.  He took ibuprofen at home with some relief.  He has no history of urinary tract infections or prostate problems.  HPI  Past Medical History:  Diagnosis Date  . Diabetes mellitus    dx 14 years ago  . Hyperkalemia   . Hyperlipidemia   . Obstructive sleep apnea    not compliant with CPAP  . RBBB (right bundle branch block)   . Syncope    recurrent unexplained syncope    Patient Active Problem List   Diagnosis Date Noted  . HLD (hyperlipidemia) 04/21/2016  . Hypoglycemia 04/21/2016  . Diabetes mellitus without complication (HCC) 04/21/2016  . Tobacco abuse 04/21/2016  . Stroke (HCC) 04/21/2016  . Stroke-like symptoms   . Seizures (HCC)   . Migraine with aura and without status migrainosus, not intractable   . Syncope 01/12/2011  . OSA on CPAP 01/12/2011    Past Surgical History:  Procedure Laterality Date  . CARDIAC CATHETERIZATION    . HERNIA REPAIR    . LOOP RECORDER IMPLANT Left         Home Medications    Prior to Admission medications   Medication Sig Start Date End Date Taking? Authorizing Provider  atorvastatin (LIPITOR) 40 MG tablet Take 40 mg by mouth at bedtime. 02/15/16  Yes [provider]  insulin aspart (NOVOLOG) 100 UNIT/ML injection Inject  1-15 Units into the skin 3 (three) times daily with meals. Sliding scale   Yes [provider]  Insulin Degludec (TRESIBA ) Inject into the skin.   Yes [provider]  levothyroxine (SYNTHROID, LEVOTHROID) 88 MCG tablet Take 88 mcg by mouth daily. 02/20/16  Yes [provider]  lisinopril (PRINIVIL,ZESTRIL) 20 MG tablet Take 20 mg by mouth daily.   Yes [provider]  aspirin 325 MG tablet Take 1 tablet (325 mg total) by mouth daily. 04/23/16   Arrien, York RamMauricio Daniel, MD  cephALEXin (KEFLEX) 500 MG capsule Take 1 capsule (500 mg total) by mouth 4 (four) times daily for 10 days. 08/29/18 09/08/18  Emi HolesLaw, Claudine Stallings M, PA-C  HYDROcodone-acetaminophen (NORCO/VICODIN) 5-325 MG tablet Take 1-2 tablets by mouth every 6 (six) hours as needed. 08/29/18   Pierre Dellarocco, Waylan BogaAlexandra M, PA-C  insulin glargine (LANTUS) 100 UNIT/ML injection Inject 0.15 mLs (15 Units total) into the skin at bedtime. 04/23/16   Arrien, York RamMauricio Daniel, MD  loratadine (CLARITIN) 10 MG tablet Take 10 mg by mouth daily.    [provider]  ondansetron (ZOFRAN) 4 MG tablet Take 1 tablet (4 mg total) by mouth every 6 (six) hours. 08/29/18   Fronia Depass, Waylan BogaAlexandra M, PA-C  valproic acid (DEPAKENE) 250 MG capsule Take 2 capsules (500 mg total) by mouth 2 (two) times daily.  04/23/16   Arrien, York Ram, MD    Family History Family History  Adopted: Yes    Social History Social History   Tobacco Use  . Smoking status: Current Some Day Smoker    Types: Cigars  . Smokeless tobacco: Never Used  . Tobacco comment: smokes several cigars per day  Substance Use Topics  . Alcohol use: Yes    Comment: several beers on the weekends  . Drug use: No     Allergies   Omnipaque [iohexol] and Phenobarbital   Review of Systems Review of Systems  Constitutional: Negative for chills and fever.  HENT: Negative for facial swelling and sore throat.   Respiratory: Negative for shortness of breath.     Cardiovascular: Negative for chest pain.  Gastrointestinal: Positive for abdominal pain (suprapubic pain). Negative for nausea and vomiting.  Genitourinary: Positive for difficulty urinating, dysuria, flank pain and hematuria. Negative for discharge, penile pain, penile swelling, scrotal swelling and testicular pain.  Musculoskeletal: Positive for back pain.  Skin: Negative for rash and wound.  Neurological: Negative for headaches.  Psychiatric/Behavioral: The patient is not nervous/anxious.      Physical Exam Updated Vital Signs BP 118/78   Pulse 83   Temp 98.8 F (37.1 C) (Oral)   Resp 16   Ht 5\' 10"  (1.778 m)   Wt 77.1 kg   SpO2 99%   BMI 24.39 kg/m   Physical Exam  Constitutional: He appears well-developed and well-nourished. No distress.  HENT:  Head: Normocephalic and atraumatic.  Mouth/Throat: Oropharynx is clear and moist. No oropharyngeal exudate.  Eyes: Pupils are equal, round, and reactive to light. Conjunctivae are normal. Right eye exhibits no discharge. Left eye exhibits no discharge. No scleral icterus.  Neck: Normal range of motion. Neck supple. No thyromegaly present.  Cardiovascular: Normal rate, regular rhythm, normal heart sounds and intact distal pulses. Exam reveals no gallop and no friction rub.  No murmur heard. Pulmonary/Chest: Effort normal and breath sounds normal. No stridor. No respiratory distress. He has no wheezes. He has no rales.  Abdominal: Soft. Bowel sounds are normal. He exhibits no distension. There is no tenderness. There is CVA tenderness (R). There is no rebound and no guarding. Hernia confirmed negative in the right inguinal area and confirmed negative in the left inguinal area.  Genitourinary: Penis normal. Right testis shows no swelling and no tenderness. Left testis shows no swelling and no tenderness.  Musculoskeletal: He exhibits no edema.  Lymphadenopathy:    He has no cervical adenopathy.  Neurological: He is alert.  Coordination normal.  Skin: Skin is warm and dry. No rash noted. He is not diaphoretic. No pallor.  Psychiatric: He has a normal mood and affect.  Nursing note and vitals reviewed.    ED Treatments / Results  Labs (all labs ordered are listed, but only abnormal results are displayed) Labs Reviewed  URINALYSIS, ROUTINE W REFLEX MICROSCOPIC - Abnormal; Notable for the following components:      Result Value   Color, Urine AMBER (*)    APPearance CLOUDY (*)    Hgb urine dipstick LARGE (*)    Bilirubin Urine SMALL (*)    Ketones, ur 15 (*)    Protein, ur 100 (*)    Nitrite POSITIVE (*)    Leukocytes, UA LARGE (*)    All other components within normal limits  URINALYSIS, MICROSCOPIC (REFLEX) - Abnormal; Notable for the following components:   Bacteria, UA MANY (*)    All other components within  normal limits  BASIC METABOLIC PANEL - Abnormal; Notable for the following components:   Glucose, Bld 177 (*)    Calcium 8.6 (*)    All other components within normal limits  CBC WITH DIFFERENTIAL/PLATELET - Abnormal; Notable for the following components:   WBC 18.6 (*)    Platelets 122 (*)    Neutro Abs 15.9 (*)    Monocytes Absolute 1.1 (*)    Abs Immature Granulocytes 0.08 (*)    All other components within normal limits  URINE CULTURE    EKG None  Radiology Ct Renal Stone Study  Result Date: 08/29/2018 CLINICAL DATA:  Right flank pain over the last 2 days. Previous history renal stone disease. EXAM: CT ABDOMEN AND PELVIS WITHOUT CONTRAST TECHNIQUE: Multidetector CT imaging of the abdomen and pelvis was performed following the standard protocol without IV contrast. COMPARISON:  04/21/2017 FINDINGS: Lower chest: Normal Hepatobiliary: Liver parenchyma appears normal without contrast. No calcified gallstones. Pancreas: Normal Spleen: Normal Adrenals/Urinary Tract: Adrenal glands are normal. Kidneys are normal. Left kidney is normal. Right kidney is normal except for a nonobstructing 2  mm stone in the upper pole. No hydronephrosis or passing stone. No stone in the bladder. Stomach/Bowel: No significant bowel finding. Moderate amount of fecal matter in the colon. Vascular/Lymphatic: Aortic atherosclerosis. No aneurysm. IVC is normal. No retroperitoneal adenopathy. Reproductive: Normal Other: No free fluid or air. Musculoskeletal: Normal IMPRESSION: No acute finding by CT. 2 mm nonobstructing stone in the upper pole of the right kidney. No evidence of hydronephrosis or passing stone. Aortic Atherosclerosis (ICD10-I70.0). Electronically Signed   By: Paulina Fusi M.D.   On: 08/29/2018 11:36    Procedures Procedures (including critical care time)  Medications Ordered in ED Medications  morphine 4 MG/ML injection 4 mg (4 mg Intravenous Given 08/29/18 1143)  sodium chloride 0.9 % bolus 1,000 mL (0 mLs Intravenous Stopped 08/29/18 1239)  cefTRIAXone (ROCEPHIN) 1 g in sodium chloride 0.9 % 100 mL IVPB (0 g Intravenous Stopped 08/29/18 1400)  ketorolac (TORADOL) 30 MG/ML injection 15 mg (15 mg Intravenous Given 08/29/18 1240)     Initial Impression / Assessment and Plan / ED Course  I have reviewed the triage vital signs and the nursing notes.  Pertinent labs & imaging results that were available during my care of the patient were reviewed by me and considered in my medical decision making (see chart for details).     Patient presenting with suspected pyelonephritis with hematuria.  Patient has no history of urinary tract infections, however has had a kidney stone 20+ years ago.Patient does have leukocytosis at 18.6.  His urine shows large hematuria, small bilirubin, 15 ketones, 100 protein, positive nitrates, large leukocytes, greater than 50 RBCs and WBCs, and many bacteria.  Will treat with Keflex.  Urine culture sent.  CT showed no acute finding, but 2 mm nonobstructing stone in the upper pole the right kidney, no evidence of hydronephrosis or passing stone.  Patient will be referred  to urology for further work-up considering no history of this.  Patient will be given Norco in addition ibuprofen for pain.  Zofran as well.  I reviewed the  narcotic database and found no discrepancies.  Return precautions discussed.  Patient understands and agrees with plan.  Patient vitals stable throughout ED course and discharged in satisfactory condition. I discussed patient case with Dr. Rush Landmark who guided the patient's management and agrees with plan.   Final Clinical Impressions(s) / ED Diagnoses   Final diagnoses:  Pyelonephritis  Gross hematuria    ED Discharge Orders         Ordered    cephALEXin (KEFLEX) 500 MG capsule  4 times daily     08/29/18 1353    HYDROcodone-acetaminophen (NORCO/VICODIN) 5-325 MG tablet  Every 6 hours PRN     08/29/18 1353    ondansetron (ZOFRAN) 4 MG tablet  Every 6 hours     08/29/18 1353           Emi Holes, PA-C 08/29/18 1959    Tegeler, Canary Brim, MD 08/30/18 [phone]

## 2018-08-29 NOTE — Discharge Instructions (Addendum)
Take Keflex until completed.  Take ibuprofen every 6 hours as prescribed for pain.  For breakthrough pain, take 1-2 Norco every 6 hours.  Do not drive or operate machinery while taking this medication.  Make sure to stay well-hydrated.  Please follow-up with the urologist considering you have no history of urinary tract infections or blood in your urine.  You may need further work-up.  Please return the emergency department he develop any new or worsening symptoms.  Do not drink alcohol, drive, operate machinery or participate in any other potentially dangerous activities while taking opiate pain medication as it may make you sleepy. Do not take this medication with any other sedating medications, either prescription or over-the-counter. If you were prescribed Percocet or Vicodin, do not take these with acetaminophen (Tylenol) as it is already contained within these medications and overdose of Tylenol is dangerous.   This medication is an opiate (or narcotic) pain medication and can be habit forming.  Use it as little as possible to achieve adequate pain control.  Do not use or use it with extreme caution if you have a history of opiate abuse or dependence. This medication is intended for your use only - do not give any to anyone else and keep it in a secure place where nobody else, especially children, have access to it. It will also cause or worsen constipation, so you may want to consider taking an over-the-counter stool softener while you are taking this medication.

## 2018-08-29 NOTE — ED Triage Notes (Signed)
Pt c/o urinary frequency, hematuria and R low back pain x 2 days.

## 2018-08-31 LAB — URINE CULTURE: Culture: >=100000 — AB

## 2018-09-01 ENCOUNTER — Telehealth: Payer: Self-pay | Admitting: Emergency Medicine

## 2018-09-01 NOTE — Telephone Encounter (Signed)
Post ED Visit - Positive Culture Follow-up  Culture report reviewed by antimicrobial stewardship pharmacist:  []  Enzo BiNathan Batchelder, Pharm.D. []  Celedonio MiyamotoJeremy Frens, Pharm.D., BCPS AQ-ID []  Garvin FilaMike Maccia, Pharm.D., BCPS []  Georgina PillionElizabeth Martin, Pharm.D., BCPS []  ShiremanstownMinh Pham, VermontPharm.D., BCPS, AAHIVP []  Estella HuskMichelle Turner, Pharm.D., BCPS, AAHIVP [x]  Lysle Pearlachel Rumbarger, PharmD, BCPS []  Phillips Climeshuy Dang, PharmD, BCPS []  Agapito GamesAlison Masters, PharmD, BCPS []  Verlan FriendsErin Deja, PharmD  Positive urine culture Treated with Cephalexin, organism sensitive to the same and no further patient follow-up is required at this time.  Mike HerterShannon Kiel Stewart 09/01/2018, 1:46 PM

## 2020-10-21 IMAGING — CT CT RENAL STONE PROTOCOL
2 of 4 series · 16 of 46 positions shown, 18 images · non-contrast
Comparison: 04/21/2017

CLINICAL DATA: Right flank pain over the last 2 days. Previous
history renal stone disease.

EXAM:
CT ABDOMEN AND PELVIS WITHOUT CONTRAST
TECHNIQUE: Multidetector CT imaging of the abdomen and pelvis was performed
following the standard protocol without IV contrast.

[Series 2: axial st · axial · 0.82mm/px · z∈[+582,+1042]mm · 13 of 101 slices shown, 15 images]
[im 5/101  soft-tissue]
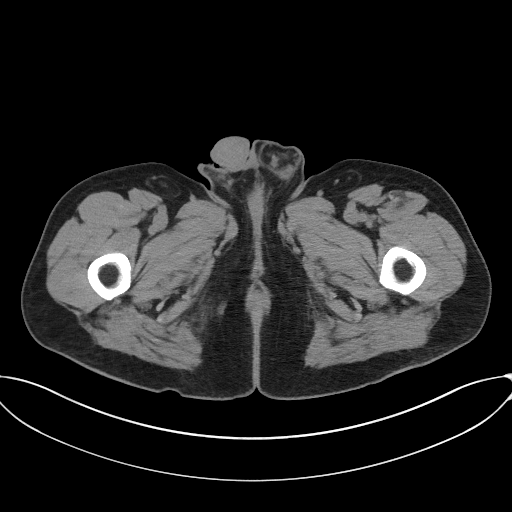
[im 5/101  bone]
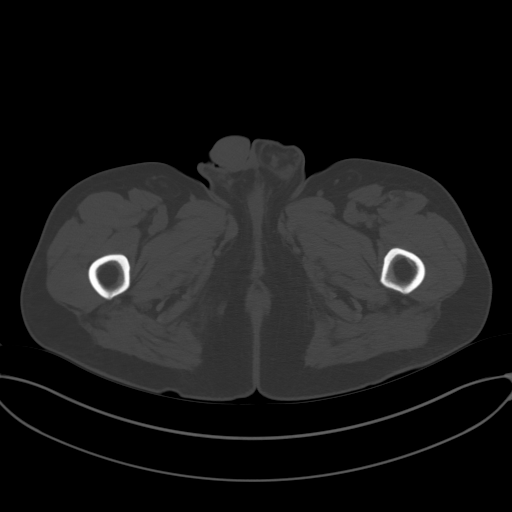
[im 13/101  soft-tissue]
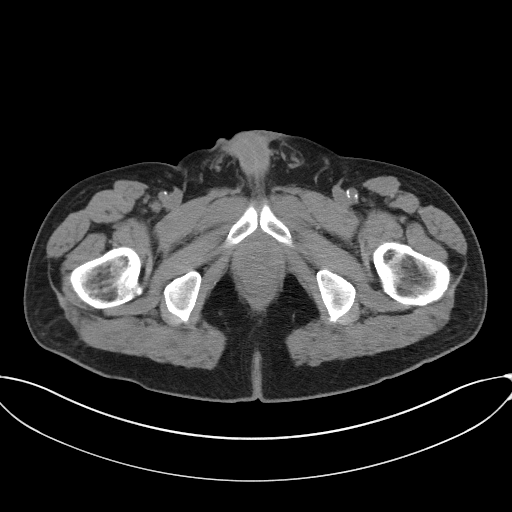
[im 21/101  soft-tissue]
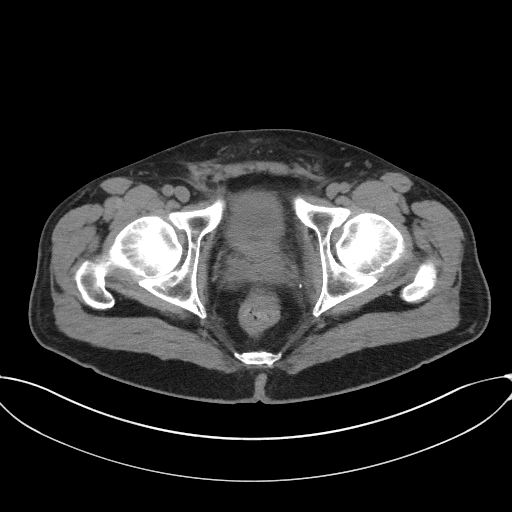
[im 29/101  soft-tissue]
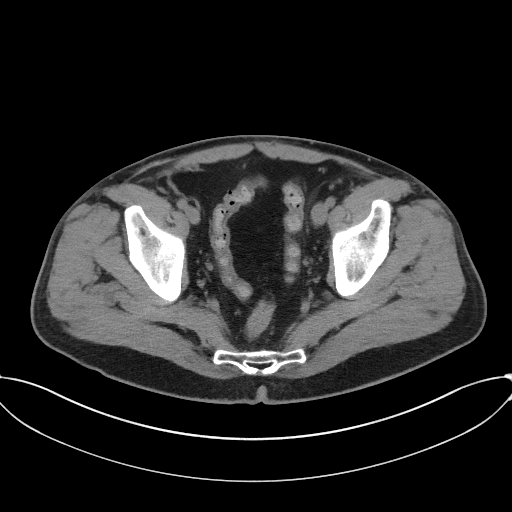
[im 37/101  soft-tissue]
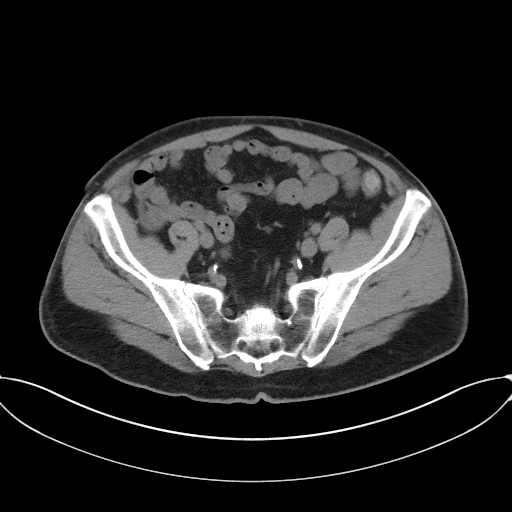
[im 45/101  soft-tissue]
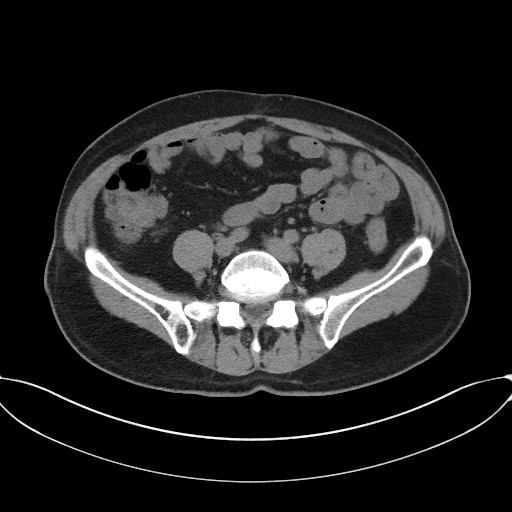
[im 53/101  soft-tissue]
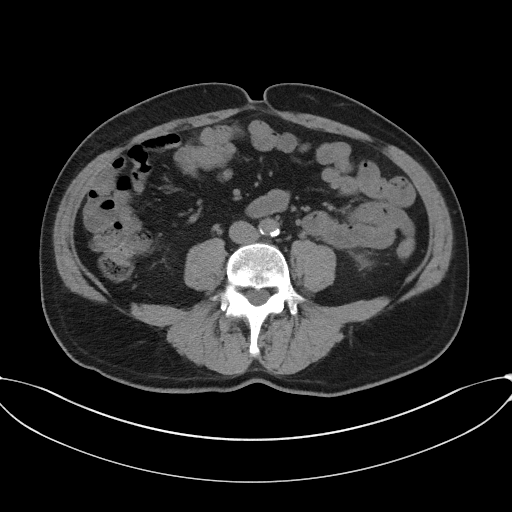
[im 57/101  soft-tissue]
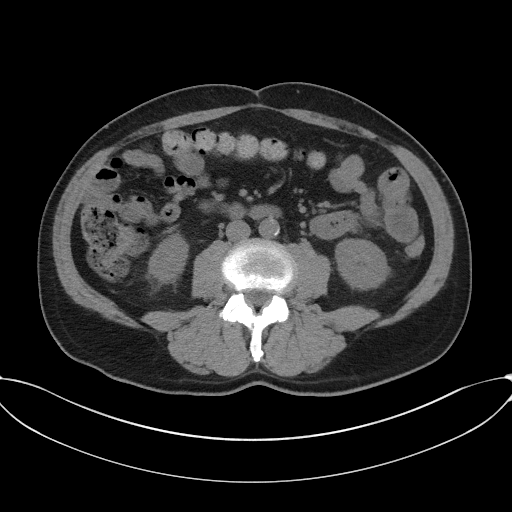
[im 65/101  soft-tissue]
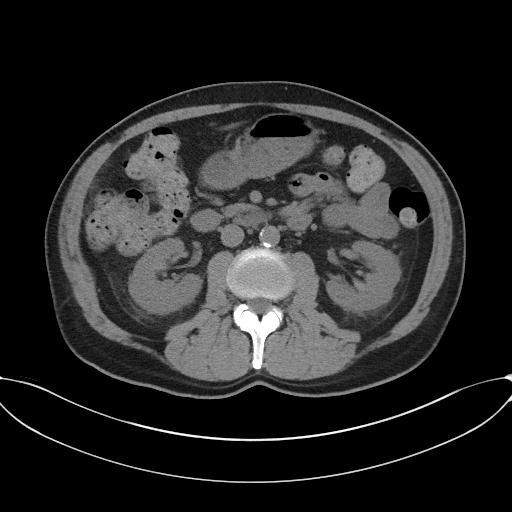
[im 65/101  bone]
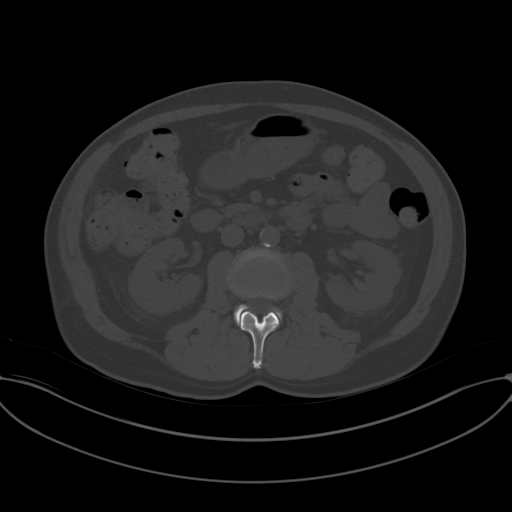
[im 73/101  soft-tissue]
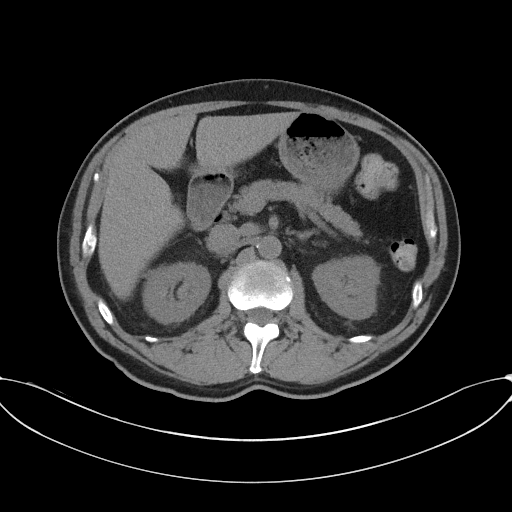
[im 81/101  soft-tissue]
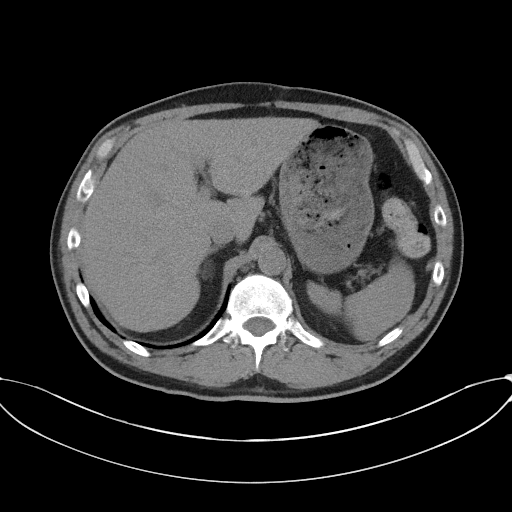
[im 89/101  soft-tissue]
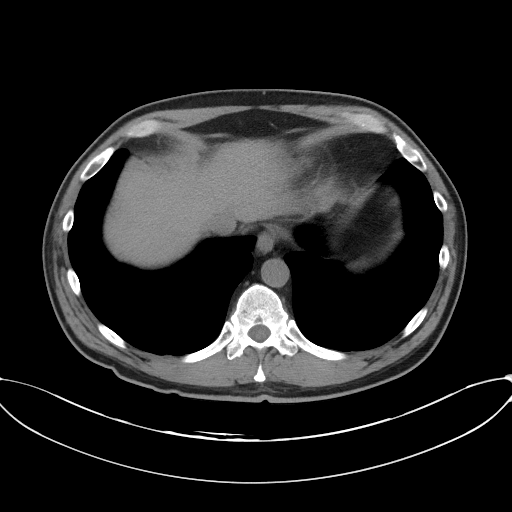
[im 97/101  soft-tissue]
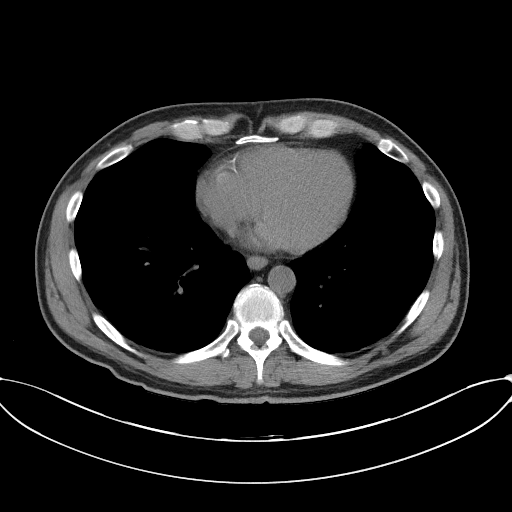

[Series 5: coronal st · coronal · 0.81mm/px · 3 of 87 slices shown]
[im 29/87  soft-tissue]
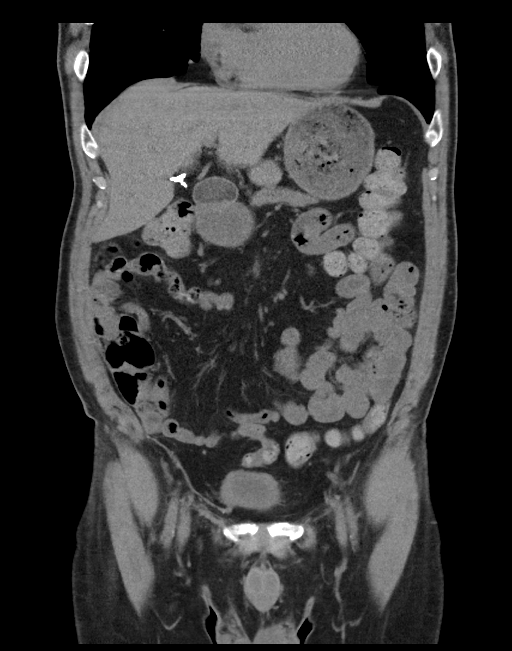
[im 39/87  soft-tissue]
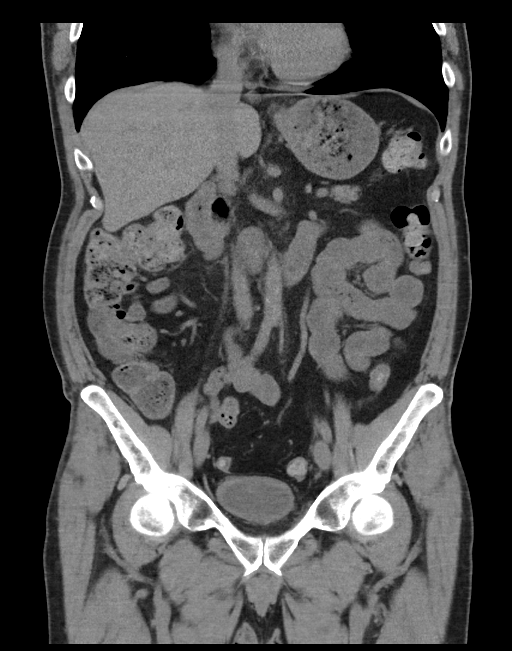
[im 48/87  soft-tissue]
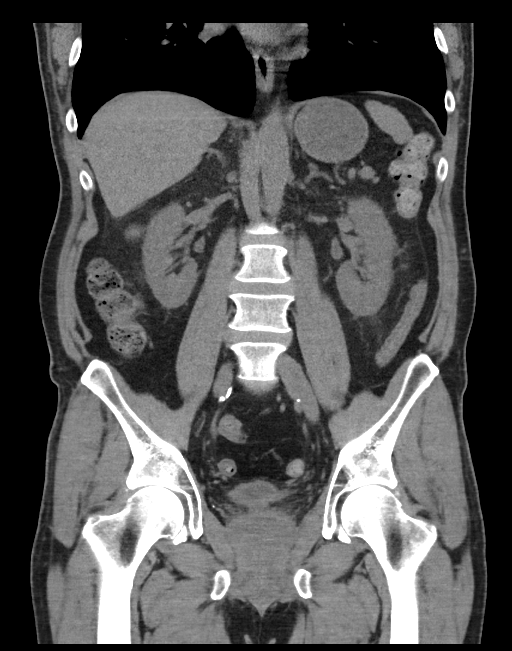

[16 of 46 positions shown; findings below may reference images not displayed]

FINDINGS: Lower chest: Normal

Hepatobiliary: Liver parenchyma appears normal without contrast. No
calcified gallstones.

Pancreas: Normal

Spleen: Normal

Adrenals/Urinary Tract: Adrenal glands are normal. Kidneys are
normal. Left kidney is normal. Right kidney is normal except for a
nonobstructing 2 mm stone in the upper pole. No hydronephrosis or
passing stone. No stone in the bladder.

Stomach/Bowel: No significant bowel finding. Moderate amount of
fecal matter in the colon.

Vascular/Lymphatic: Aortic atherosclerosis. No aneurysm. IVC is
normal. No retroperitoneal adenopathy.

Reproductive: Normal

Other: No free fluid or air.

Musculoskeletal: Normal
IMPRESSION: No acute finding by CT. 2 mm nonobstructing stone in the upper pole
of the right kidney. No evidence of hydronephrosis or passing stone.

Aortic Atherosclerosis ([id number].L).

## 2021-08-18 ENCOUNTER — Encounter (HOSPITAL_BASED_OUTPATIENT_CLINIC_OR_DEPARTMENT_OTHER): Payer: Self-pay

## 2021-08-18 ENCOUNTER — Other Ambulatory Visit: Payer: Self-pay

## 2021-08-18 ENCOUNTER — Emergency Department (HOSPITAL_BASED_OUTPATIENT_CLINIC_OR_DEPARTMENT_OTHER)
Admission: EM | Admit: 2021-08-18 | Discharge: 2021-08-18 | Disposition: A | Discharge status: Home / Self Care | Payer: BC Managed Care – PPO | Attending: Emergency Medicine | Admitting: Emergency Medicine

## 2021-08-18 DIAGNOSIS — Z79899 Other long term (current) drug therapy: Secondary | ICD-10-CM | POA: Diagnosis not present

## 2021-08-18 DIAGNOSIS — Z7982 Long term (current) use of aspirin: Secondary | ICD-10-CM | POA: Insufficient documentation

## 2021-08-18 DIAGNOSIS — E1169 Type 2 diabetes mellitus with other specified complication: Secondary | ICD-10-CM | POA: Diagnosis not present

## 2021-08-18 DIAGNOSIS — B029 Zoster without complications: Secondary | ICD-10-CM | POA: Diagnosis not present

## 2021-08-18 DIAGNOSIS — E785 Hyperlipidemia, unspecified: Secondary | ICD-10-CM | POA: Insufficient documentation

## 2021-08-18 DIAGNOSIS — F17290 Nicotine dependence, other tobacco product, uncomplicated: Secondary | ICD-10-CM | POA: Insufficient documentation

## 2021-08-18 DIAGNOSIS — R21 Rash and other nonspecific skin eruption: Secondary | ICD-10-CM | POA: Diagnosis present

## 2021-08-18 DIAGNOSIS — Z794 Long term (current) use of insulin: Secondary | ICD-10-CM | POA: Insufficient documentation

## 2021-08-18 MED ORDER — ACYCLOVIR 800 MG PO TABS: 800.0000 mg | ORAL_TABLET | Freq: Every day | ORAL | 0 refills | Status: AC

## 2021-08-18 NOTE — ED Provider Notes (Signed)
[facility] EMERGENCY DEPARTMENT Provider Note   CSN: [id number] Arrival date & time: 08/18/21  [id number]     History Chief Complaint  Patient presents with   Rash    Mike Stewart is a 65 y.o. male.  65 y.o male with a PMH of DM, stroke, seizures presents to the ED with a chief complaint of rash x 3 days. Patient reports he was on a trip in [address], began with his hunting trip.  He reports there is significant pain to the area.  Has been using over-the-counter cream to help with shingles however there has not been any relief.  He denies any prior immunization of shingles.  He denies any tick bite, no fevers, rash does not involve any extremity or face.   The history is provided by the patient and medical records.  Rash Location:  Torso Torso rash location:  R flank Quality: painful   Pain details:    Quality:  Tingling and sharp   Severity:  Mild   Onset quality:  Sudden   Duration:  3 days   Timing:  Constant Associated symptoms: no abdominal pain, no fever and no shortness of breath       Past Medical History:  Diagnosis Date   Diabetes mellitus    dx 14 years ago   Hyperkalemia    Hyperlipidemia    Obstructive sleep apnea    not compliant with CPAP   RBBB (right bundle branch block)    Syncope    recurrent unexplained syncope    Patient Active Problem List   Diagnosis Date Noted   HLD (hyperlipidemia) 04/21/2016   Hypoglycemia 04/21/2016   Diabetes mellitus without complication (HCC) 04/21/2016   Tobacco abuse 04/21/2016   Stroke (HCC) 04/21/2016   Stroke-like symptoms    Seizures (HCC)    Migraine with aura and without status migrainosus, not intractable    Syncope 01/12/2011   OSA on CPAP 01/12/2011    Past Surgical History:  Procedure Laterality Date   CARDIAC CATHETERIZATION     HERNIA REPAIR     LOOP RECORDER IMPLANT Left        Family History  Adopted: Yes    Social History   Tobacco Use   Smoking status: Some Days    Types:  Cigars   Smokeless tobacco: Never   Tobacco comments:    smokes several cigars per day  Vaping Use   Vaping Use: Never used  Substance Use Topics   Alcohol use: Yes    Comment: several beers on the weekends   Drug use: No    Home Medications Prior to Admission medications   Medication Sig Start Date End Date Taking? Authorizing Provider  acyclovir (ZOVIRAX) 800 MG tablet Take 1 tablet (800 mg total) by mouth 5 (five) times daily for 7 days. 08/18/21 08/25/21 Yes Camiya Vinal, Leonie Douglas, PA-C  aspirin 325 MG tablet Take 1 tablet (325 mg total) by mouth daily. 04/23/16   Arrien, York Ram, MD  atorvastatin (LIPITOR) 40 MG tablet Take 40 mg by mouth at bedtime. 02/15/16   [provider]  HYDROcodone-acetaminophen (NORCO/VICODIN) 5-325 MG tablet Take 1-2 tablets by mouth every 6 (six) hours as needed. 08/29/18   Law, Waylan Boga, PA-C  insulin aspart (NOVOLOG) 100 UNIT/ML injection Inject 1-15 Units into the skin 3 (three) times daily with meals. Sliding scale    [provider]  Insulin Degludec (TRESIBA [place]) Inject into the skin.    [provider]  insulin glargine (  LANTUS) 100 UNIT/ML injection Inject 0.15 mLs (15 Units total) into the skin at bedtime. 04/23/16   Arrien, York Ram, MD  levothyroxine (SYNTHROID, LEVOTHROID) 88 MCG tablet Take 88 mcg by mouth daily. 02/20/16   [provider]  lisinopril (PRINIVIL,ZESTRIL) 20 MG tablet Take 20 mg by mouth daily.    [provider]  loratadine (CLARITIN) 10 MG tablet Take 10 mg by mouth daily.    [provider]  ondansetron (ZOFRAN) 4 MG tablet Take 1 tablet (4 mg total) by mouth every 6 (six) hours. 08/29/18   Law, Waylan Boga, PA-C  valproic acid (DEPAKENE) 250 MG capsule Take 2 capsules (500 mg total) by mouth 2 (two) times daily. 04/23/16   Arrien, York Ram, MD    Allergies    Omnipaque [iohexol] and Phenobarbital  Review of Systems   Review of Systems  Constitutional:  Negative  for chills and fever.  Respiratory:  Negative for shortness of breath.   Cardiovascular:  Negative for chest pain.  Gastrointestinal:  Negative for abdominal pain.  Skin:  Positive for rash.  All other systems reviewed and are negative.  Physical Exam Updated Vital Signs BP (!) 157/80 (BP Location: Right Arm)   Pulse 87   Temp 97.9 F (36.6 C) (Oral)   Resp 18   Ht 5\' 10"  (1.778 m)   Wt 77.1 kg   SpO2 99%   BMI 24.39 kg/m   Physical Exam Vitals and nursing note reviewed.  Constitutional:      Appearance: Normal appearance.  HENT:     Head: Normocephalic and atraumatic.     Mouth/Throat:     Mouth: Mucous membranes are moist.  Eyes:     Pupils: Pupils are equal, round, and reactive to light.  Cardiovascular:     Rate and Rhythm: Normal rate.  Pulmonary:     Effort: Pulmonary effort is normal.  Abdominal:     General: Abdomen is flat.     Tenderness: There is no abdominal tenderness.  Musculoskeletal:     Cervical back: Normal range of motion and neck supple.  Skin:    General: Skin is warm and dry.     Findings: Rash present. Rash is vesicular.     Comments: Vesicular rash in the distribution of a dermatomal pattern, does not cross the midline with surrounding erythema.   Neurological:     Mental Status: He is alert and oriented to person, place, and time.       ED Results / Procedures / Treatments   Labs (all labs ordered are listed, but only abnormal results are displayed) Labs Reviewed - No data to display  EKG None  Radiology No results found.  Procedures Procedures   Medications Ordered in ED Medications - No data to display  ED Course  I have reviewed the triage vital signs and the nursing notes.  Pertinent labs & imaging results that were available during my care of the patient were reviewed by me and considered in my medical decision making (see chart for details).    MDM Rules/Calculators/A&P   Presents to the ED with a chief  complaint of rash has been ongoing for the past 3 days.  No prior history of shingles, no prior history of immunization for this.  Reports the rash began with a tingly feeling, now has had vesicular pattern to it which does not cross the midline.  There are some surrounding erythema, along with some active vesicles noted.  He has been trying  over-the-counter shingles medication without improvement in symptoms.  During my exam he is overall well-appearing, vitals are within normal limits.  He denies any fever, screening of his abdomen, trunk, extremities without any visible tick bites, no systemic signs.  Presentation consistent with shingles with a rash that does not cross the midline.  Treat with acyclovir 100 every 5 for the next 7 days.  He is agreeable to follow-up with PCP or return if symptoms worsen.  Patient is stable for discharge.   Portions of this note were generated with Scientist, clinical (histocompatibility and immunogenetics). Dictation errors may occur despite best attempts at proofreading.  Final Clinical Impression(s) / ED Diagnoses Final diagnoses:  Herpes zoster without complication    Rx / DC Orders ED Discharge Orders          Ordered    acyclovir (ZOVIRAX) 800 MG tablet  5 times daily        08/18/21 1025             Claude Manges, PA-C 08/18/21 1027    Milagros Loll, MD 08/18/21 2006

## 2021-08-18 NOTE — ED Triage Notes (Signed)
Initial outbreak occurred  Thursday 08/15/21    Painful horizontally running Rash along right side of trunk from umbilicus to spine  Using OTC Shingles cream

## 2021-08-18 NOTE — Discharge Instructions (Addendum)
I prescribed medication in order to help treat your rash.  Please take 1 tablet 5 times a day for the next 7 days.  If the rash spreads to any other part of your body, you develop a fever, or worsening symptoms you will need to return to the emergency department.

## 2023-03-29 ENCOUNTER — Encounter (HOSPITAL_BASED_OUTPATIENT_CLINIC_OR_DEPARTMENT_OTHER): Payer: Self-pay

## 2023-03-29 ENCOUNTER — Other Ambulatory Visit: Payer: Self-pay

## 2023-03-29 ENCOUNTER — Emergency Department (HOSPITAL_BASED_OUTPATIENT_CLINIC_OR_DEPARTMENT_OTHER)
Admission: EM | Admit: 2023-03-29 | Discharge: 2023-03-29 | Disposition: A | Discharge status: Home / Self Care | Payer: No Typology Code available for payment source | Attending: Emergency Medicine | Admitting: Emergency Medicine

## 2023-03-29 DIAGNOSIS — F17200 Nicotine dependence, unspecified, uncomplicated: Secondary | ICD-10-CM | POA: Diagnosis not present

## 2023-03-29 DIAGNOSIS — M7989 Other specified soft tissue disorders: Secondary | ICD-10-CM | POA: Diagnosis not present

## 2023-03-29 DIAGNOSIS — M79661 Pain in right lower leg: Secondary | ICD-10-CM | POA: Diagnosis not present

## 2023-03-29 DIAGNOSIS — I10 Essential (primary) hypertension: Secondary | ICD-10-CM | POA: Insufficient documentation

## 2023-03-29 DIAGNOSIS — E109 Type 1 diabetes mellitus without complications: Secondary | ICD-10-CM | POA: Insufficient documentation

## 2023-03-29 DIAGNOSIS — D72829 Elevated white blood cell count, unspecified: Secondary | ICD-10-CM | POA: Diagnosis not present

## 2023-03-29 DIAGNOSIS — Z79899 Other long term (current) drug therapy: Secondary | ICD-10-CM | POA: Diagnosis not present

## 2023-03-29 DIAGNOSIS — Z7982 Long term (current) use of aspirin: Secondary | ICD-10-CM | POA: Diagnosis not present

## 2023-03-29 LAB — CBC
HCT: 43.6 % (ref 39.0–52.0)
Hemoglobin: 15 g/dL (ref 13.0–17.0)
MCH: 33.3 pg (ref 26.0–34.0)
MCHC: 34.4 g/dL (ref 30.0–36.0)
MCV: 96.7 fL (ref 80.0–100.0)
Platelets: 90 10*3/uL — ABNORMAL LOW (ref 150–400)
RBC: 4.51 MIL/uL (ref 4.22–5.81)
RDW: 14.2 % (ref 11.5–15.5)
WBC: 12.6 10*3/uL — ABNORMAL HIGH (ref 4.0–10.5)
nRBC: 0 % (ref 0.0–0.2)

## 2023-03-29 LAB — BASIC METABOLIC PANEL
Anion gap: 6 (ref 5–15)
BUN: 16 mg/dL (ref 8–23)
CO2: 24 mmol/L (ref 22–32)
Calcium: 9.6 mg/dL (ref 8.9–10.3)
Chloride: 105 mmol/L (ref 98–111)
Creatinine, Ser: 1.08 mg/dL (ref 0.61–1.24)
GFR, Estimated: >60 mL/min (ref >60)
Glucose, Bld: 219 mg/dL — ABNORMAL HIGH (ref 70–99)
Potassium: 4.2 mmol/L (ref 3.5–5.1)
Sodium: 135 mmol/L (ref 135–145)

## 2023-03-29 MED ORDER — ENOXAPARIN SODIUM 100 MG/ML IJ SOSY
1.0000 mg/kg | PREFILLED_SYRINGE | Freq: Once | INTRAMUSCULAR | Status: AC
  Administered 2023-03-29: 85 mg via SUBCUTANEOUS
  Filled 2023-03-29: qty 1

## 2023-03-29 MED ORDER — OXYCODONE HCL 5 MG PO TABS
5.0000 mg | ORAL_TABLET | Freq: Once | ORAL | Status: AC
  Administered 2023-03-29: 5 mg via ORAL
  Filled 2023-03-29: qty 1

## 2023-03-29 MED ORDER — CEPHALEXIN 250 MG PO CAPS
250.0000 mg | ORAL_CAPSULE | Freq: Once | ORAL | Status: AC
  Administered 2023-03-29: 250 mg via ORAL
  Filled 2023-03-29: qty 1

## 2023-03-29 MED ORDER — ACETAMINOPHEN 500 MG PO TABS
1000.0000 mg | ORAL_TABLET | Freq: Once | ORAL | Status: AC
  Administered 2023-03-29: 1000 mg via ORAL
  Filled 2023-03-29: qty 2

## 2023-03-29 MED ORDER — CEPHALEXIN 500 MG PO CAPS: 500.0000 mg | ORAL_CAPSULE | Freq: Four times a day (QID) | ORAL | 0 refills | Status: AC

## 2023-03-29 NOTE — ED Triage Notes (Signed)
Patient stated last night his right leg has started to swell and hurt. He stated it started his calf and moved to his whole lower leg. Pulse present in triage. He had a 10 hour drive today.

## 2023-03-29 NOTE — Discharge Instructions (Addendum)
Thank you for coming to [facility] Emergency Department. You were seen for right leg pain/swelling. We did an exam, labs, and he showed an elevated white blood cell count and lower leg swelling concerning for either infection or blood clot.  We treated you with a blood thinner while you are here in the emergency department to cover you and you can get your ultrasound here tomorrow morning.  Please return to the [facility] emergency department tomorrow morning for your right leg ultrasound.  We have also prescribed Keflex as it is possible this is a cellulitis, or soft tissue infection. Please follow up with your primary care provider within 1 week.   Do not hesitate to return to the ED or call 911 if you experience: -Worsening symptoms -Chest pain, shortness of breath -Lightheadedness, passing out -Fevers/chills -Anything else that concerns you

## 2023-03-29 NOTE — ED Provider Notes (Signed)
[place] [facility] Provider Note   CSN: [id number] Arrival date & time: 03/29/23  1721     History {Add pertinent medical, surgical, social history, OB history to HPI:1} Chief Complaint  Patient presents with   Leg Swelling    Mike Stewart is a 67 y.o. male with *** presents with ***.   Patient stated last night his right leg has started to swell and hurt. He stated it started his calf and moved to his whole lower leg. Pulse present in triage. He had a 10 hour drive today.   HPI     Home Medications Prior to Admission medications   Medication Sig Start Date End Date Taking? Authorizing Provider  aspirin 325 MG tablet Take 1 tablet (325 mg total) by mouth daily. 04/23/16   Arrien, York Ram, MD  atorvastatin (LIPITOR) 40 MG tablet Take 40 mg by mouth at bedtime. 02/15/16   [provider]  HYDROcodone-acetaminophen (NORCO/VICODIN) 5-325 MG tablet Take 1-2 tablets by mouth every 6 (six) hours as needed. 08/29/18   Law, Waylan Boga, PA-C  insulin aspart (NOVOLOG) 100 UNIT/ML injection Inject 1-15 Units into the skin 3 (three) times daily with meals. Sliding scale    [provider]  Insulin Degludec (TRESIBA [place]) Inject into the skin.    [provider]  insulin glargine (LANTUS) 100 UNIT/ML injection Inject 0.15 mLs (15 Units total) into the skin at bedtime. 04/23/16   Arrien, York Ram, MD  levothyroxine (SYNTHROID, LEVOTHROID) 88 MCG tablet Take 88 mcg by mouth daily. 02/20/16   [provider]  lisinopril (PRINIVIL,ZESTRIL) 20 MG tablet Take 20 mg by mouth daily.    [provider]  loratadine (CLARITIN) 10 MG tablet Take 10 mg by mouth daily.    [provider]  ondansetron (ZOFRAN) 4 MG tablet Take 1 tablet (4 mg total) by mouth every 6 (six) hours. 08/29/18   Law, Waylan Boga, PA-C  valproic acid (DEPAKENE) 250 MG capsule Take 2 capsules (500 mg total) by mouth 2 (two) times daily.  04/23/16   Arrien, York Ram, MD      Allergies    Omnipaque [iohexol] and Phenobarbital    Review of Systems   Review of Systems A 10 point review of systems was performed and is negative unless otherwise reported in HPI.  Physical Exam Updated Vital Signs BP (!) 154/74   Pulse 67   Temp 98.3 F (36.8 C) (Oral)   Resp 15   Ht 5\' 10"  (1.778 m)   Wt 85.4 kg   SpO2 98%   BMI 27.01 kg/m  Physical Exam General: Normal appearing {Desc; male/male:11659}, lying in bed.  HEENT: PERRLA, Sclera anicteric, MMM, trachea midline.  Cardiology: RRR, no murmurs/rubs/gallops. BL radial and DP pulses equal bilaterally.  Resp: Normal respiratory rate and effort. CTAB, no wheezes, rhonchi, crackles.  Abd: Soft, non-tender, non-distended. No rebound tenderness or guarding.  GU: Deferred. MSK: No peripheral edema or signs of trauma. Extremities without deformity or TTP. No cyanosis or clubbing. Skin: warm, dry. No rashes or lesions. Back: No CVA tenderness Neuro: A&Ox4, CNs II-XII grossly intact. MAEs. Sensation grossly intact.  Psych: Normal mood and affect.   ED Results / Procedures / Treatments   Labs (all labs ordered are listed, but only abnormal results are displayed) Labs Reviewed  CBC - Abnormal; Notable for the following components:      Result Value   WBC 12.6 (*)    Platelets 90 (*)  All other components within normal limits  BASIC METABOLIC PANEL - Abnormal; Notable for the following components:   Glucose, Bld 219 (*)    All other components within normal limits    EKG None  Radiology No results found.  Procedures Procedures  {Document cardiac monitor, telemetry assessment procedure when appropriate:1}  Medications Ordered in ED Medications  oxyCODONE (Oxy IR/ROXICODONE) immediate release tablet 5 mg (5 mg Oral Given 03/29/23 2016)    ED Course/ Medical Decision Making/ A&P                          Medical Decision Making Amount and/or Complexity of  Data Reviewed Labs:  Decision-making details documented in ED Course.    This patient presents to the ED for concern of ***, this involves an extensive number of treatment options, and is a complaint that carries with it a high risk of complications and morbidity.  I considered the following differential and admission for this acute, potentially life threatening condition.   MDM:    ***  Clinical Course as of 03/29/23 2122  Wynelle Link Mar 29, 2023  2014 WBC(!): 12.6 +leukocytosis [HN]  2015 Platelets(!): 90 Thrombocytopenia, seems to be chronically worsening over the last 6 years. No petechiae/purpura, no bleeding today [HN]    Clinical Course User Index [HN] Loetta Rough, MD    Labs: I Ordered, and personally interpreted labs.  The pertinent results include:  ***  Imaging Studies ordered: I ordered imaging studies including *** I independently visualized and interpreted imaging. I agree with the radiologist interpretation  Additional history obtained from ***.  External records from outside source obtained and reviewed including ***  Cardiac Monitoring: The patient was maintained on a cardiac monitor.  I personally viewed and interpreted the cardiac monitored which showed an underlying rhythm of: ***  Reevaluation: After the interventions noted above, I reevaluated the patient and found that they have :{resolved/improved/worsened:23923::"improved"}  Social Determinants of Health: ***  Disposition:  ***  Co morbidities that complicate the patient evaluation  Past Medical History:  Diagnosis Date   Diabetes mellitus    dx 14 years ago   Hyperkalemia    Hyperlipidemia    Obstructive sleep apnea    not compliant with CPAP   RBBB (right bundle branch block)    Syncope    recurrent unexplained syncope     Medicines Meds ordered this encounter  Medications   oxyCODONE (Oxy IR/ROXICODONE) immediate release tablet 5 mg    I have reviewed the patients home medicines  and have made adjustments as needed  Problem List / ED Course: Problem List Items Addressed This Visit   None        {Document critical care time when appropriate:1} {Document review of labs and clinical decision tools ie heart score, Chads2Vasc2 etc:1}  {Document your independent review of radiology images, and any outside records:1} {Document your discussion with family members, caretakers, and with consultants:1} {Document social determinants of health affecting pt's care:1} {Document your decision making why or why not admission, treatments were needed:1}  This note was created using dictation software, which may contain spelling or grammatical errors.

## 2023-03-30 ENCOUNTER — Ambulatory Visit (HOSPITAL_BASED_OUTPATIENT_CLINIC_OR_DEPARTMENT_OTHER)
Admission: RE | Admit: 2023-03-30 | Discharge: 2023-03-30 | Disposition: A | Discharge status: Home / Self Care | Payer: No Typology Code available for payment source | Source: Ambulatory Visit | Attending: Emergency Medicine | Admitting: Emergency Medicine

## 2023-03-30 ENCOUNTER — Other Ambulatory Visit (HOSPITAL_BASED_OUTPATIENT_CLINIC_OR_DEPARTMENT_OTHER): Payer: Self-pay | Admitting: Emergency Medicine

## 2023-03-30 ENCOUNTER — Other Ambulatory Visit (HOSPITAL_BASED_OUTPATIENT_CLINIC_OR_DEPARTMENT_OTHER): Payer: Self-pay

## 2023-03-30 ENCOUNTER — Emergency Department (HOSPITAL_BASED_OUTPATIENT_CLINIC_OR_DEPARTMENT_OTHER)
Admission: EM | Admit: 2023-03-30 | Discharge: 2023-03-30 | Disposition: A | Discharge status: Home / Self Care | Payer: No Typology Code available for payment source | Attending: Emergency Medicine | Admitting: Emergency Medicine

## 2023-03-30 DIAGNOSIS — M7989 Other specified soft tissue disorders: Secondary | ICD-10-CM

## 2023-03-30 DIAGNOSIS — E10649 Type 1 diabetes mellitus with hypoglycemia without coma: Secondary | ICD-10-CM | POA: Insufficient documentation

## 2023-03-30 DIAGNOSIS — I824Y1 Acute embolism and thrombosis of unspecified deep veins of right proximal lower extremity: Secondary | ICD-10-CM | POA: Insufficient documentation

## 2023-03-30 DIAGNOSIS — M79604 Pain in right leg: Secondary | ICD-10-CM | POA: Diagnosis present

## 2023-03-30 DIAGNOSIS — I10 Essential (primary) hypertension: Secondary | ICD-10-CM | POA: Diagnosis not present

## 2023-03-30 DIAGNOSIS — Z7901 Long term (current) use of anticoagulants: Secondary | ICD-10-CM | POA: Insufficient documentation

## 2023-03-30 DIAGNOSIS — Z8673 Personal history of transient ischemic attack (TIA), and cerebral infarction without residual deficits: Secondary | ICD-10-CM | POA: Diagnosis not present

## 2023-03-30 DIAGNOSIS — Z7982 Long term (current) use of aspirin: Secondary | ICD-10-CM | POA: Diagnosis not present

## 2023-03-30 DIAGNOSIS — Z794 Long term (current) use of insulin: Secondary | ICD-10-CM | POA: Diagnosis not present

## 2023-03-30 DIAGNOSIS — M79661 Pain in right lower leg: Secondary | ICD-10-CM | POA: Insufficient documentation

## 2023-03-30 LAB — CBG MONITORING, ED
Glucose-Capillary: 48 mg/dL — ABNORMAL LOW (ref 70–99)
Glucose-Capillary: 78 mg/dL (ref 70–99)

## 2023-03-30 MED ORDER — HYDROCODONE-ACETAMINOPHEN 5-325 MG PO TABS
1.0000 | ORAL_TABLET | Freq: Once | ORAL | Status: AC
  Administered 2023-03-30: 1 via ORAL
  Filled 2023-03-30: qty 1

## 2023-03-30 MED ORDER — RIVAROXABAN (XARELTO) VTE STARTER PACK (15 & 20 MG)
ORAL_TABLET | ORAL | 0 refills | Status: AC
  Filled 2023-03-30: qty 51, 30d supply, fill #0

## 2023-03-30 NOTE — ED Triage Notes (Signed)
Patient presents to ED via POV from PCP office. Positive DVT to right leg. Endorses pain to same. Denies shortness of breath.

## 2023-03-30 NOTE — ED Notes (Signed)
Juice and crackers given with peanut butter for blood sugar of 48.

## 2023-03-30 NOTE — ED Provider Notes (Addendum)
[place] [facility] Provider Note   CSN: [id number] Arrival date & time: 03/30/23  1004     History  Chief Complaint  Patient presents with   Leg Pain   HPI Mike Stewart is a 67 y.o. male with  with T1DM, h/o CVA, seizures, migraines, thrombocytopenia, OSA on CPAP, HLD, HTN, tobacco abuse presenting for follow-up DVT study.  Was seen yesterday for right leg pain.  DVT study this morning revealed DVT in the right leg.  Advised patient of findings and recommended that he be seen today in the ED.  States that symptoms are unchanged from yesterday.    Leg Pain      Home Medications Prior to Admission medications   Medication Sig Start Date End Date Taking? Authorizing Provider  RIVAROXABAN Carlena Hurl) VTE STARTER PACK (15 & 20 MG) Follow package directions: Take one 15mg  tablet by mouth twice a day. On day 22, switch to one 20mg  tablet once a day. Take with food. 03/30/23  Yes Gareth Eagle, PA-C  aspirin 325 MG tablet Take 1 tablet (325 mg total) by mouth daily. 04/23/16   Arrien, York Ram, MD  atorvastatin (LIPITOR) 40 MG tablet Take 40 mg by mouth at bedtime. 02/15/16   [provider]  cephALEXin (KEFLEX) 500 MG capsule Take 1 capsule (500 mg total) by mouth 4 (four) times daily. 03/29/23   Loetta Rough, MD  HYDROcodone-acetaminophen (NORCO/VICODIN) 5-325 MG tablet Take 1-2 tablets by mouth every 6 (six) hours as needed. 08/29/18   Law, Waylan Boga, PA-C  insulin aspart (NOVOLOG) 100 UNIT/ML injection Inject 1-15 Units into the skin 3 (three) times daily with meals. Sliding scale    [provider]  Insulin Degludec (TRESIBA [place]) Inject into the skin.    [provider]  insulin glargine (LANTUS) 100 UNIT/ML injection Inject 0.15 mLs (15 Units total) into the skin at bedtime. 04/23/16   Arrien, York Ram, MD  levothyroxine (SYNTHROID, LEVOTHROID) 88 MCG tablet Take 88 mcg by mouth daily. 02/20/16   [provider]  lisinopril (PRINIVIL,ZESTRIL) 20 MG tablet Take 20 mg by mouth daily.    [provider]  loratadine (CLARITIN) 10 MG tablet Take 10 mg by mouth daily.    [provider]  ondansetron (ZOFRAN) 4 MG tablet Take 1 tablet (4 mg total) by mouth every 6 (six) hours. 08/29/18   Law, Waylan Boga, PA-C  valproic acid (DEPAKENE) 250 MG capsule Take 2 capsules (500 mg total) by mouth 2 (two) times daily. 04/23/16   Arrien, York Ram, MD      Allergies    Omnipaque [iohexol] and Phenobarbital    Review of Systems   See HPI for pertinent positives Physical Exam Updated Vital Signs BP (!) 141/75   Pulse 86   Temp 98 F (36.7 C) (Oral)   Resp 17   Ht 5\' 10"  (1.778 m)   Wt 83.9 kg   SpO2 100%   BMI 26.54 kg/m  Physical Exam Constitutional:      Appearance: Normal appearance.  HENT:     Head: Normocephalic.     Nose: Nose normal.  Eyes:     Conjunctiva/sclera: Conjunctivae normal.  Cardiovascular:     Rate and Rhythm: Normal rate and regular rhythm.  Pulmonary:     Effort: Pulmonary effort is normal.     Breath sounds: Normal breath sounds.  Neurological:     Mental Status: He is alert.  Psychiatric:  Mood and Affect: Mood normal.     ED Results / Procedures / Treatments   Labs (all labs ordered are listed, but only abnormal results are displayed) Labs Reviewed  CBG MONITORING, ED - Abnormal; Notable for the following components:      Result Value   Glucose-Capillary 48 (*)    All other components within normal limits  CBG MONITORING, ED    EKG None  Radiology US Venous Img Lower Unilateral Right (DVT)  Result Date: 03/30/2023 CLINICAL DATA:  Right lower extremity pain and swelling EXAM: RIGHT LOWER EXTREMITY VENOUS DOPPLER ULTRASOUND TECHNIQUE: Gray-scale sonography with graded compression, as well as color Doppler and duplex ultrasound were performed to evaluate the lower extremity deep venous systems from the level of the  common femoral vein and including the common femoral, femoral, profunda femoral, popliteal and calf veins including the posterior tibial, peroneal and gastrocnemius veins when visible. The superficial great saphenous vein was also interrogated. Spectral Doppler was utilized to evaluate flow at rest and with distal augmentation maneuvers in the common femoral, femoral and popliteal veins. COMPARISON:  None Available. FINDINGS: Contralateral Common Femoral Vein: Respiratory phasicity is normal and symmetric with the symptomatic side. No evidence of thrombus. Normal compressibility. Common Femoral Vein: No evidence of thrombus. Normal compressibility, respiratory phasicity and response to augmentation. Saphenofemoral Junction: No evidence of thrombus. Normal compressibility and flow on color Doppler imaging. Profunda Femoral Vein: No evidence of thrombus. Normal compressibility and flow on color Doppler imaging. Femoral Vein: Partially occlusive thrombus seen in the proximal and mid femoral vein. Occlusive thrombus seen in the distal femoral vein. Popliteal Vein: Occlusive thrombus of the right popliteal vein. Calf Veins: Partially occlusive thrombus of the right peroneal and posterior tibial veins. Superficial Great Saphenous Vein: No evidence of thrombus. Normal compressibility. Venous Reflux:  None. Other Findings:  None. IMPRESSION: 1. Acute DVT of the right femoral, popliteal, peroneal, and posterior tibial veins. 2. Popliteal thrombus is occlusive while femoral, peroneal, posterior tibial vein thrombus is nonocclusive. These results will be called to the ordering clinician or representative by the Radiologist Assistant, and communication documented in the PACS or [employer]. Electronically Signed   By: Acquanetta Belling M.D.   On: 03/30/2023 09:50    Procedures Procedures    Medications Ordered in ED Medications  HYDROcodone-acetaminophen (NORCO/VICODIN) 5-325 MG per tablet 1 tablet (1 tablet Oral Given  03/30/23 1124)    ED Course/ Medical Decision Making/ A&P Clinical Course as of 03/30/23 1136  Mon Mar 30, 2023  1100 Noted to be hypoglycemic on arrival.  Given juice and crackers.  Recheck was improved. [JR]  1121 Spoke to Dr. Silvestre Moment of [facility] hematology who advised that it would be appropriate to start him on Xarelto and follow-up with his hematologist Dr. Welton Flakes in clinic. [JR]    Clinical Course User Index [JR] Gareth Eagle, PA-C                             Medical Decision Making Risk Prescription drug management.   67 year old well-appearing male presenting for follow-up DVT study. Ultrasound did reveal concern for acute DVT in the right femoral, popliteal and peroneal and posterior tibial veins.  Reviewed his labs from yesterday which did not reveal any acute derangements.  Spoke to hematology at [facility] who follows him for his chronic thrombocytopenia.  Advised to go ahead and start him on Xarelto.  Sent starter pack to community  pharmacy here advised that he would receive education on the medication at the pharmacy.  Advised him to follow-up with his hematologist.  Vitals remained stable throughout encounter.  Considered PE but unlikely given not tachycardic and no chest pain or shortness of breath. Patient did endorse right leg pain.  Treated with Norco.  Advised conservative treatment at home for his right leg pain.  Darted him on Xarelto.  Vital stable discharge.  Discharged home.        Final Clinical Impression(s) / ED Diagnoses Final diagnoses:  Acute deep vein thrombosis (DVT) of proximal vein of right lower extremity [facility])    Rx / DC Orders ED Discharge Orders          Ordered    RIVAROXABAN (XARELTO) VTE STARTER PACK (15 & 20 MG)        03/30/23 1128              Gareth Eagle, PA-C 03/30/23 1134    Gareth Eagle, PA-C 03/30/23 1136    Rolan Bucco, MD 03/30/23 1428

## 2023-03-30 NOTE — Discharge Instructions (Addendum)
Ultrasound did reveal that you have a DVT in your right leg.  This is a clot in the right leg.  Treatment for it is Xarelto.  I did check with your hematologist who advised to start on Xarelto and follow-up in the clinic with Dr. Welton Flakes, your hematologist.  If you have shortness of breath, chest pain, worsening leg pain calf tenderness or any other concerning symptom please return emerged part for further evaluation.
